# Patient Record
Sex: Female | Born: 1980 | Race: White | Hispanic: No | Smoking: Never smoker
Health system: Southern US, Community
[De-identification: ages and names within clinical notes are randomized; demographics above are authoritative.]

## PROBLEM LIST (undated history)

## (undated) DIAGNOSIS — F988 Other specified behavioral and emotional disorders with onset usually occurring in childhood and adolescence: Secondary | ICD-10-CM

## (undated) DIAGNOSIS — K259 Gastric ulcer, unspecified as acute or chronic, without hemorrhage or perforation: Secondary | ICD-10-CM

## (undated) DIAGNOSIS — N390 Urinary tract infection, site not specified: Secondary | ICD-10-CM

## (undated) DIAGNOSIS — T8859XA Other complications of anesthesia, initial encounter: Secondary | ICD-10-CM

## (undated) DIAGNOSIS — T4145XA Adverse effect of unspecified anesthetic, initial encounter: Secondary | ICD-10-CM

## (undated) DIAGNOSIS — R51 Headache: Secondary | ICD-10-CM

## (undated) DIAGNOSIS — J40 Bronchitis, not specified as acute or chronic: Secondary | ICD-10-CM

## (undated) DIAGNOSIS — K219 Gastro-esophageal reflux disease without esophagitis: Secondary | ICD-10-CM

## (undated) DIAGNOSIS — L509 Urticaria, unspecified: Secondary | ICD-10-CM

## (undated) DIAGNOSIS — N200 Calculus of kidney: Secondary | ICD-10-CM

## (undated) DIAGNOSIS — R519 Headache, unspecified: Secondary | ICD-10-CM

## (undated) DIAGNOSIS — Z9889 Other specified postprocedural states: Secondary | ICD-10-CM

## (undated) DIAGNOSIS — J189 Pneumonia, unspecified organism: Secondary | ICD-10-CM

## (undated) DIAGNOSIS — D509 Iron deficiency anemia, unspecified: Secondary | ICD-10-CM

## (undated) DIAGNOSIS — R112 Nausea with vomiting, unspecified: Secondary | ICD-10-CM

## (undated) HISTORY — PX: WISDOM TOOTH EXTRACTION: SHX21

## (undated) HISTORY — DX: Urticaria, unspecified: L50.9

## (undated) HISTORY — PX: CHOLECYSTECTOMY: SHX55

## (undated) HISTORY — PX: TYMPANOSTOMY TUBE PLACEMENT: SHX32

## (undated) HISTORY — PX: TONSILLECTOMY: SUR1361

## (undated) HISTORY — PX: UPPER GI ENDOSCOPY: SHX6162

## (undated) HISTORY — PX: OTHER SURGICAL HISTORY: SHX169

## (undated) HISTORY — PX: BREAST SURGERY: SHX581

## (undated) HISTORY — PX: INTUSSUSCEPTION REPAIR: SHX1847

## (undated) HISTORY — PX: GASTRIC BYPASS: SHX52

---
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
## 2003-08-24 ENCOUNTER — Other Ambulatory Visit: Admission: RE | Admit: 2003-08-24 | Discharge: 2003-08-24 | Payer: Self-pay | Admitting: Obstetrics & Gynecology

## 2007-02-03 ENCOUNTER — Inpatient Hospital Stay (HOSPITAL_COMMUNITY): Admission: AD | Admit: 2007-02-03 | Discharge: 2007-02-07 | Payer: Self-pay | Admitting: Obstetrics and Gynecology

## 2009-02-27 ENCOUNTER — Inpatient Hospital Stay (HOSPITAL_COMMUNITY): Admission: AD | Admit: 2009-02-27 | Discharge: 2009-02-27 | Payer: Self-pay | Admitting: Obstetrics & Gynecology

## 2009-04-05 ENCOUNTER — Inpatient Hospital Stay (HOSPITAL_COMMUNITY): Admission: AD | Admit: 2009-04-05 | Discharge: 2009-04-08 | Payer: Self-pay | Admitting: Obstetrics and Gynecology

## 2010-12-10 ENCOUNTER — Encounter: Payer: Self-pay | Admitting: Obstetrics and Gynecology

## 2011-02-27 LAB — CBC
HCT: 33.3 % — ABNORMAL LOW (ref 36.0–46.0)
HCT: 39.6 % (ref 36.0–46.0)
Hemoglobin: 13.6 g/dL (ref 12.0–15.0)
MCHC: 34.5 g/dL (ref 30.0–36.0)
MCV: 89.5 fL (ref 78.0–100.0)
RBC: 3.72 MIL/uL — ABNORMAL LOW (ref 3.87–5.11)
RBC: 4.39 MIL/uL (ref 3.87–5.11)
WBC: 18.7 10*3/uL — ABNORMAL HIGH (ref 4.0–10.5)

## 2011-02-28 LAB — URINALYSIS, ROUTINE W REFLEX MICROSCOPIC
Glucose, UA: NEGATIVE mg/dL
Specific Gravity, Urine: 1.02 (ref 1.005–1.030)
pH: 6.5 (ref 5.0–8.0)

## 2011-02-28 LAB — WET PREP, GENITAL

## 2014-09-29 ENCOUNTER — Other Ambulatory Visit: Payer: Self-pay | Admitting: Gastroenterology

## 2014-09-29 DIAGNOSIS — R1084 Generalized abdominal pain: Secondary | ICD-10-CM

## 2014-10-04 ENCOUNTER — Ambulatory Visit
Admission: RE | Admit: 2014-10-04 | Discharge: 2014-10-04 | Disposition: A | Discharge status: Home / Self Care | Payer: BC Managed Care – PPO | Source: Ambulatory Visit | Attending: Gastroenterology | Admitting: Gastroenterology

## 2014-10-04 DIAGNOSIS — R1084 Generalized abdominal pain: Secondary | ICD-10-CM

## 2014-10-04 MED ORDER — IOHEXOL 300 MG/ML  SOLN
100.0000 mL | Freq: Once | INTRAMUSCULAR | Status: AC | PRN
  Administered 2014-10-04: 100 mL via INTRAVENOUS

## 2015-03-30 ENCOUNTER — Other Ambulatory Visit: Payer: Self-pay | Admitting: General Surgery

## 2015-04-11 NOTE — Patient Instructions (Addendum)
Your procedure is scheduled on:  04/19/15  TUESDAY  [facility]- FOLLOW SIGNS TO SHORT STAY CENTER Short Stay Center at   0530   AM.   Call this number if you have problems the morning of surgery: [phone]        Do not eat food  Or drink :After Midnight. Monday NIGHT   Take these medicines the morning of surgery with A SIP OF WATER:Ranitidine (Zantac); Dexilant; Flonase if needed    .  Contacts, dentures or partial plates, or metal hairpins  can not be worn to surgery. Your family will be responsible for glasses, dentures, hearing aides while you are in surgery  Leave suitcase in the car. After surgery it may be brought to your room.  For patients admitted to the hospital, checkout time is 11:00 AM day of  discharge.         [facility] IS NOT RESPONSIBLE FOR ANY VALUABLES  Only one person with you in short stay morning of surgery. If time permits after you are ready, others may visit. No more than 2 person in room at a time.                                                                                               [facility] - Preparing for Surgery Before surgery, you can play an important role.  Because skin is not sterile, your skin needs to be as free of germs as possible.  You can reduce the number of germs on your skin by washing with CHG (chlorahexidine gluconate) soap before surgery.  CHG is an antiseptic cleaner which kills germs and bonds with the skin to continue killing germs even after washing. Please DO NOT use if you have an allergy to CHG or antibacterial soaps.  If your skin becomes reddened/irritated stop using the CHG and inform your nurse when you arrive at Short Stay. Do not shave (including legs and underarms) for at least 48 hours prior to the first CHG shower.  You may shave your face/neck. Please follow these instructions carefully:  1.  Shower with CHG Soap the night before surgery and the  morning of Surgery.  2.  If  you choose to wash your hair, wash your hair first as usual with your  normal  shampoo.  3.  After you shampoo, rinse your hair and body thoroughly to remove the  shampoo.                           4.  Use CHG as you would any other liquid soap.  You can apply chg directly  to the skin and wash                       Gently with a scrungie or clean washcloth.  5.  Apply the CHG Soap to your body ONLY FROM THE NECK DOWN.   Do not use on face/ open  Wound or open sores. Avoid contact with eyes, ears mouth and genitals (private parts).                       Wash face,  Genitals (private parts) with your normal soap.             6.  Wash thoroughly, paying special attention to the area where your surgery  will be performed.  7.  Thoroughly rinse your body with warm water from the neck down.  8.  DO NOT shower/wash with your normal soap after using and rinsing off  the CHG Soap.                9.  Pat yourself dry with a clean towel.            10.  Wear clean pajamas.            11.  Place clean sheets on your bed the night of your first shower and do not  sleep with pets. Day of Surgery : Do not apply any lotions/deodorants the morning of surgery.  Please wear clean clothes to the hospital/surgery center.  FAILURE TO FOLLOW THESE INSTRUCTIONS MAY RESULT IN THE CANCELLATION OF YOUR SURGERY PATIENT SIGNATURE_________________________________  NURSE SIGNATURE__________________________________  ________________________________________________________________________

## 2015-04-12 ENCOUNTER — Encounter (HOSPITAL_COMMUNITY)
Admission: RE | Admit: 2015-04-12 | Discharge: 2015-04-12 | Disposition: A | Discharge status: Home / Self Care | Payer: BLUE CROSS/BLUE SHIELD | Source: Ambulatory Visit | Attending: General Surgery | Admitting: General Surgery

## 2015-04-12 ENCOUNTER — Encounter (HOSPITAL_COMMUNITY): Payer: Self-pay

## 2015-04-12 DIAGNOSIS — Z01812 Encounter for preprocedural laboratory examination: Secondary | ICD-10-CM | POA: Diagnosis not present

## 2015-04-12 DIAGNOSIS — Z431 Encounter for attention to gastrostomy: Secondary | ICD-10-CM | POA: Insufficient documentation

## 2015-04-12 HISTORY — DX: Pneumonia, unspecified organism: J18.9

## 2015-04-12 HISTORY — DX: Other specified postprocedural states: Z98.890

## 2015-04-12 HISTORY — DX: Bronchitis, not specified as acute or chronic: J40

## 2015-04-12 HISTORY — DX: Gastric ulcer, unspecified as acute or chronic, without hemorrhage or perforation: K25.9

## 2015-04-12 HISTORY — DX: Gastro-esophageal reflux disease without esophagitis: K21.9

## 2015-04-12 HISTORY — DX: Other complications of anesthesia, initial encounter: T88.59XA

## 2015-04-12 HISTORY — DX: Headache, unspecified: R51.9

## 2015-04-12 HISTORY — DX: Headache: R51

## 2015-04-12 HISTORY — DX: Calculus of kidney: N20.0

## 2015-04-12 HISTORY — DX: Other specified behavioral and emotional disorders with onset usually occurring in childhood and adolescence: F98.8

## 2015-04-12 HISTORY — DX: Adverse effect of unspecified anesthetic, initial encounter: T41.45XA

## 2015-04-12 HISTORY — DX: Urinary tract infection, site not specified: N39.0

## 2015-04-12 HISTORY — DX: Nausea with vomiting, unspecified: R11.2

## 2015-04-12 HISTORY — DX: Iron deficiency anemia, unspecified: D50.9

## 2015-04-12 LAB — BASIC METABOLIC PANEL
Anion gap: 8 (ref 5–15)
BUN: 11 mg/dL (ref 6–20)
CALCIUM: 8.5 mg/dL — AB (ref 8.9–10.3)
CO2: 27 mmol/L (ref 22–32)
Chloride: 105 mmol/L (ref 101–111)
Creatinine, Ser: 0.77 mg/dL (ref 0.44–1.00)
GFR calc Af Amer: >60 mL/min (ref >60)
GFR calc non Af Amer: >60 mL/min (ref >60)
Glucose, Bld: 93 mg/dL (ref 65–99)
POTASSIUM: 4.4 mmol/L (ref 3.5–5.1)
SODIUM: 140 mmol/L (ref 135–145)

## 2015-04-12 LAB — CBC
HEMATOCRIT: 31.8 % — AB (ref 36.0–46.0)
HEMOGLOBIN: 10 g/dL — AB (ref 12.0–15.0)
MCH: 27 pg (ref 26.0–34.0)
MCHC: 31.4 g/dL (ref 30.0–36.0)
MCV: 85.9 fL (ref 78.0–100.0)
PLATELETS: 274 10*3/uL (ref 150–400)
RBC: 3.7 MIL/uL — ABNORMAL LOW (ref 3.87–5.11)
RDW: 14.4 % (ref 11.5–15.5)
WBC: 8.9 10*3/uL (ref 4.0–10.5)

## 2015-04-12 LAB — HCG, SERUM, QUALITATIVE: PREG SERUM: NEGATIVE

## 2015-04-12 NOTE — Progress Notes (Addendum)
CT abd Pevis with contrast per epic 10/04/2014 lungs mentioned OV note per chart per Dr Dulce Sellarutlaw 01/17/2015

## 2015-04-12 NOTE — Progress Notes (Signed)
CBC results per epic per PAT visit 04/12/2015 sent to Dr Johna SheriffHoxworth

## 2015-04-22 ENCOUNTER — Inpatient Hospital Stay (HOSPITAL_COMMUNITY): Payer: BLUE CROSS/BLUE SHIELD | Admitting: Anesthesiology

## 2015-04-22 ENCOUNTER — Encounter (HOSPITAL_COMMUNITY): Payer: Self-pay | Admitting: *Deleted

## 2015-04-22 ENCOUNTER — Inpatient Hospital Stay (HOSPITAL_COMMUNITY)
Admission: RE | Admit: 2015-04-22 | Discharge: 2015-04-25 | DRG: 328 | Disposition: A | Discharge status: Home / Self Care | Payer: BLUE CROSS/BLUE SHIELD | Source: Ambulatory Visit | Attending: General Surgery | Admitting: General Surgery

## 2015-04-22 ENCOUNTER — Encounter (HOSPITAL_COMMUNITY)
Admission: RE | Disposition: A | Discharge status: Home / Self Care | Payer: Self-pay | Source: Ambulatory Visit | Attending: General Surgery

## 2015-04-22 DIAGNOSIS — F329 Major depressive disorder, single episode, unspecified: Secondary | ICD-10-CM | POA: Diagnosis present

## 2015-04-22 DIAGNOSIS — K219 Gastro-esophageal reflux disease without esophagitis: Secondary | ICD-10-CM | POA: Diagnosis present

## 2015-04-22 DIAGNOSIS — Z8701 Personal history of pneumonia (recurrent): Secondary | ICD-10-CM

## 2015-04-22 DIAGNOSIS — T85898A Other specified complication of other internal prosthetic devices, implants and grafts, initial encounter: Secondary | ICD-10-CM | POA: Diagnosis present

## 2015-04-22 DIAGNOSIS — Z79899 Other long term (current) drug therapy: Secondary | ICD-10-CM | POA: Diagnosis not present

## 2015-04-22 DIAGNOSIS — Z01812 Encounter for preprocedural laboratory examination: Secondary | ICD-10-CM

## 2015-04-22 DIAGNOSIS — Z823 Family history of stroke: Secondary | ICD-10-CM | POA: Diagnosis not present

## 2015-04-22 DIAGNOSIS — Z87440 Personal history of urinary (tract) infections: Secondary | ICD-10-CM | POA: Diagnosis not present

## 2015-04-22 DIAGNOSIS — G44209 Tension-type headache, unspecified, not intractable: Secondary | ICD-10-CM | POA: Diagnosis present

## 2015-04-22 DIAGNOSIS — F988 Other specified behavioral and emotional disorders with onset usually occurring in childhood and adolescence: Secondary | ICD-10-CM | POA: Diagnosis present

## 2015-04-22 DIAGNOSIS — Z881 Allergy status to other antibiotic agents status: Secondary | ICD-10-CM | POA: Diagnosis not present

## 2015-04-22 DIAGNOSIS — Z8249 Family history of ischemic heart disease and other diseases of the circulatory system: Secondary | ICD-10-CM

## 2015-04-22 DIAGNOSIS — K289 Gastrojejunal ulcer, unspecified as acute or chronic, without hemorrhage or perforation: Secondary | ICD-10-CM | POA: Diagnosis present

## 2015-04-22 DIAGNOSIS — Z888 Allergy status to other drugs, medicaments and biological substances status: Secondary | ICD-10-CM

## 2015-04-22 DIAGNOSIS — G44211 Episodic tension-type headache, intractable: Secondary | ICD-10-CM | POA: Diagnosis not present

## 2015-04-22 DIAGNOSIS — Z8041 Family history of malignant neoplasm of ovary: Secondary | ICD-10-CM

## 2015-04-22 DIAGNOSIS — F909 Attention-deficit hyperactivity disorder, unspecified type: Secondary | ICD-10-CM | POA: Diagnosis not present

## 2015-04-22 DIAGNOSIS — R51 Headache: Secondary | ICD-10-CM

## 2015-04-22 DIAGNOSIS — Z833 Family history of diabetes mellitus: Secondary | ICD-10-CM

## 2015-04-22 DIAGNOSIS — Z803 Family history of malignant neoplasm of breast: Secondary | ICD-10-CM | POA: Diagnosis not present

## 2015-04-22 DIAGNOSIS — Z9884 Bariatric surgery status: Secondary | ICD-10-CM

## 2015-04-22 DIAGNOSIS — Z8711 Personal history of peptic ulcer disease: Secondary | ICD-10-CM

## 2015-04-22 DIAGNOSIS — D509 Iron deficiency anemia, unspecified: Secondary | ICD-10-CM | POA: Diagnosis present

## 2015-04-22 DIAGNOSIS — F419 Anxiety disorder, unspecified: Secondary | ICD-10-CM | POA: Diagnosis present

## 2015-04-22 DIAGNOSIS — Z87442 Personal history of urinary calculi: Secondary | ICD-10-CM | POA: Diagnosis not present

## 2015-04-22 DIAGNOSIS — R519 Headache, unspecified: Secondary | ICD-10-CM

## 2015-04-22 HISTORY — PX: LAPAROSCOPIC REVISION OF GASTROJEJUNOSTOMY: SHX5922

## 2015-04-22 HISTORY — PX: UPPER GI ENDOSCOPY: SHX6162

## 2015-04-22 LAB — HEMOGLOBIN AND HEMATOCRIT, BLOOD
HCT: 32.8 % — ABNORMAL LOW (ref 36.0–46.0)
Hemoglobin: 10.4 g/dL — ABNORMAL LOW (ref 12.0–15.0)

## 2015-04-22 SURGERY — REVISION, ANASTOMOSIS, GASTROJEJUNAL, LAPAROSCOPIC
Anesthesia: General

## 2015-04-22 MED ORDER — MORPHINE SULFATE 2 MG/ML IJ SOLN
2.0000 mg | INTRAMUSCULAR | Status: DC | PRN
  Administered 2015-04-22: 4 mg via INTRAVENOUS
  Administered 2015-04-22: 2 mg via INTRAVENOUS
  Administered 2015-04-22: 4 mg via INTRAVENOUS
  Administered 2015-04-22: 2 mg via INTRAVENOUS
  Administered 2015-04-23: 6 mg via INTRAVENOUS
  Administered 2015-04-23 (×2): 4 mg via INTRAVENOUS
  Administered 2015-04-23 (×3): 6 mg via INTRAVENOUS
  Administered 2015-04-23: 2 mg via INTRAVENOUS
  Administered 2015-04-23 (×3): 6 mg via INTRAVENOUS
  Administered 2015-04-24 (×3): 2 mg via INTRAVENOUS
  Administered 2015-04-24 (×5): 6 mg via INTRAVENOUS
  Filled 2015-04-22: qty 1
  Filled 2015-04-22: qty 3
  Filled 2015-04-22: qty 2
  Filled 2015-04-22: qty 3
  Filled 2015-04-22 (×2): qty 1
  Filled 2015-04-22: qty 2
  Filled 2015-04-22 (×2): qty 3
  Filled 2015-04-22: qty 2
  Filled 2015-04-22 (×4): qty 3
  Filled 2015-04-22: qty 1
  Filled 2015-04-22 (×4): qty 3
  Filled 2015-04-22 (×2): qty 2
  Filled 2015-04-22: qty 3

## 2015-04-22 MED ORDER — MIDAZOLAM HCL 5 MG/5ML IJ SOLN
INTRAMUSCULAR | Status: DC | PRN
  Administered 2015-04-22: 2 mg via INTRAVENOUS

## 2015-04-22 MED ORDER — ACETAMINOPHEN 160 MG/5ML PO SOLN
325.0000 mg | ORAL | Status: DC | PRN
  Administered 2015-04-25 (×2): 650 mg via ORAL
  Filled 2015-04-22 (×2): qty 20.3

## 2015-04-22 MED ORDER — FENTANYL CITRATE (PF) 250 MCG/5ML IJ SOLN
INTRAMUSCULAR | Status: AC
  Filled 2015-04-22: qty 5

## 2015-04-22 MED ORDER — OXYCODONE HCL 5 MG/5ML PO SOLN
5.0000 mg | ORAL | Status: DC | PRN
Start: 2015-04-23 — End: 2015-04-25
  Administered 2015-04-23: 5 mg via ORAL
  Administered 2015-04-24 – 2015-04-25 (×5): 10 mg via ORAL
  Filled 2015-04-22 (×3): qty 10
  Filled 2015-04-22: qty 25
  Filled 2015-04-22 (×2): qty 10

## 2015-04-22 MED ORDER — BUPIVACAINE-EPINEPHRINE 0.25% -1:200000 IJ SOLN
INTRAMUSCULAR | Status: AC
  Filled 2015-04-22: qty 1

## 2015-04-22 MED ORDER — FENTANYL CITRATE (PF) 100 MCG/2ML IJ SOLN
INTRAMUSCULAR | Status: DC | PRN
  Administered 2015-04-22 (×3): 50 ug via INTRAVENOUS
  Administered 2015-04-22: 100 ug via INTRAVENOUS

## 2015-04-22 MED ORDER — ONDANSETRON HCL 4 MG/2ML IJ SOLN
4.0000 mg | INTRAMUSCULAR | Status: DC | PRN
  Administered 2015-04-22 – 2015-04-24 (×4): 4 mg via INTRAVENOUS
  Filled 2015-04-22 (×4): qty 2

## 2015-04-22 MED ORDER — CHLORHEXIDINE GLUCONATE 4 % EX LIQD: 1.0000 "application " | Freq: Once | CUTANEOUS | Status: DC

## 2015-04-22 MED ORDER — DEXAMETHASONE SODIUM PHOSPHATE 10 MG/ML IJ SOLN
INTRAMUSCULAR | Status: DC | PRN
Start: 2015-04-22 — End: 2015-04-22
  Administered 2015-04-22: 10 mg via INTRAVENOUS

## 2015-04-22 MED ORDER — SCOPOLAMINE 1 MG/3DAYS TD PT72
MEDICATED_PATCH | TRANSDERMAL | Status: DC | PRN
  Administered 2015-04-22: 1 via TRANSDERMAL

## 2015-04-22 MED ORDER — LACTATED RINGERS IV SOLN
INTRAVENOUS | Status: DC
  Administered 2015-04-22: 1000 mL via INTRAVENOUS
  Administered 2015-04-22 (×2): via INTRAVENOUS

## 2015-04-22 MED ORDER — HEPARIN SODIUM (PORCINE) 5000 UNIT/ML IJ SOLN
5000.0000 [IU] | Freq: Three times a day (TID) | INTRAMUSCULAR | Status: DC
Start: 2015-04-23 — End: 2015-04-25
  Administered 2015-04-23 – 2015-04-25 (×7): 5000 [IU] via SUBCUTANEOUS
  Filled 2015-04-22 (×10): qty 1

## 2015-04-22 MED ORDER — HYDROMORPHONE HCL 1 MG/ML IJ SOLN
0.2500 mg | INTRAMUSCULAR | Status: DC | PRN
  Administered 2015-04-22 (×4): 0.5 mg via INTRAVENOUS

## 2015-04-22 MED ORDER — UNJURY VANILLA POWDER
2.0000 [oz_av] | Freq: Four times a day (QID) | ORAL | Status: DC
  Administered 2015-04-24 – 2015-04-25 (×4): 2 [oz_av] via ORAL

## 2015-04-22 MED ORDER — LACTATED RINGERS IR SOLN
Status: DC | PRN
  Administered 2015-04-22: 1000 mL

## 2015-04-22 MED ORDER — UNJURY CHOCOLATE CLASSIC POWDER: 2.0000 [oz_av] | Freq: Four times a day (QID) | ORAL | Status: DC

## 2015-04-22 MED ORDER — ROCURONIUM BROMIDE 100 MG/10ML IV SOLN
INTRAVENOUS | Status: AC
  Filled 2015-04-22: qty 1

## 2015-04-22 MED ORDER — ROCURONIUM BROMIDE 100 MG/10ML IV SOLN
INTRAVENOUS | Status: DC | PRN
  Administered 2015-04-22 (×3): 10 mg via INTRAVENOUS
  Administered 2015-04-22: 40 mg via INTRAVENOUS

## 2015-04-22 MED ORDER — SCOPOLAMINE 1 MG/3DAYS TD PT72
MEDICATED_PATCH | TRANSDERMAL | Status: AC
  Filled 2015-04-22: qty 1

## 2015-04-22 MED ORDER — EVICEL 5 ML EX KIT
PACK | CUTANEOUS | Status: DC | PRN
  Administered 2015-04-22: 1

## 2015-04-22 MED ORDER — PROPOFOL 10 MG/ML IV BOLUS
INTRAVENOUS | Status: DC | PRN
  Administered 2015-04-22: 150 mg via INTRAVENOUS

## 2015-04-22 MED ORDER — TISSEEL VH 10 ML EX KIT
PACK | CUTANEOUS | Status: AC
  Filled 2015-04-22: qty 1

## 2015-04-22 MED ORDER — LIDOCAINE HCL (CARDIAC) 20 MG/ML IV SOLN
INTRAVENOUS | Status: AC
  Filled 2015-04-22: qty 5

## 2015-04-22 MED ORDER — MIDAZOLAM HCL 2 MG/2ML IJ SOLN
INTRAMUSCULAR | Status: AC
  Filled 2015-04-22: qty 2

## 2015-04-22 MED ORDER — GLYCOPYRROLATE 0.2 MG/ML IJ SOLN
INTRAMUSCULAR | Status: AC
  Filled 2015-04-22: qty 3

## 2015-04-22 MED ORDER — KCL IN DEXTROSE-NACL 20-5-0.9 MEQ/L-%-% IV SOLN
INTRAVENOUS | Status: DC
  Administered 2015-04-22 – 2015-04-23 (×2): via INTRAVENOUS
  Administered 2015-04-23: 1000 mL via INTRAVENOUS
  Administered 2015-04-23 – 2015-04-24 (×2): via INTRAVENOUS
  Filled 2015-04-22 (×8): qty 1000

## 2015-04-22 MED ORDER — HYDROMORPHONE HCL 1 MG/ML IJ SOLN
INTRAMUSCULAR | Status: DC | PRN
  Administered 2015-04-22: 0.5 mg via INTRAVENOUS
  Administered 2015-04-22: 1 mg via INTRAVENOUS
  Administered 2015-04-22 (×5): 0.5 mg via INTRAVENOUS

## 2015-04-22 MED ORDER — PROPOFOL 10 MG/ML IV BOLUS
INTRAVENOUS | Status: AC
  Filled 2015-04-22: qty 20

## 2015-04-22 MED ORDER — NEOSTIGMINE METHYLSULFATE 10 MG/10ML IV SOLN
INTRAVENOUS | Status: DC | PRN
  Administered 2015-04-22: 3.5 mg via INTRAVENOUS

## 2015-04-22 MED ORDER — LIDOCAINE HCL (CARDIAC) 20 MG/ML IV SOLN
INTRAVENOUS | Status: DC | PRN
  Administered 2015-04-22: 50 mg via INTRAVENOUS

## 2015-04-22 MED ORDER — DEXTROSE 5 % IV SOLN
2.0000 g | INTRAVENOUS | Status: AC
  Administered 2015-04-22 (×2): 2 g via INTRAVENOUS

## 2015-04-22 MED ORDER — ACETAMINOPHEN 160 MG/5ML PO SOLN
650.0000 mg | ORAL | Status: DC | PRN
  Administered 2015-04-23: 650 mg via ORAL
  Filled 2015-04-22: qty 20.3

## 2015-04-22 MED ORDER — GLYCOPYRROLATE 0.2 MG/ML IJ SOLN
INTRAMUSCULAR | Status: DC | PRN
  Administered 2015-04-22: .6 mg via INTRAVENOUS

## 2015-04-22 MED ORDER — BUPIVACAINE-EPINEPHRINE 0.25% -1:200000 IJ SOLN
INTRAMUSCULAR | Status: DC | PRN
  Administered 2015-04-22: 35 mL

## 2015-04-22 MED ORDER — HYDROMORPHONE HCL 1 MG/ML IJ SOLN
INTRAMUSCULAR | Status: AC
  Filled 2015-04-22: qty 1

## 2015-04-22 MED ORDER — UNJURY CHICKEN SOUP POWDER: 2.0000 [oz_av] | Freq: Four times a day (QID) | ORAL | Status: DC

## 2015-04-22 MED ORDER — ONDANSETRON HCL 4 MG/2ML IJ SOLN
INTRAMUSCULAR | Status: AC
  Filled 2015-04-22: qty 2

## 2015-04-22 MED ORDER — LACTATED RINGERS IV SOLN: INTRAVENOUS | Status: DC

## 2015-04-22 MED ORDER — HYDROMORPHONE HCL 2 MG/ML IJ SOLN
INTRAMUSCULAR | Status: AC
  Filled 2015-04-22: qty 1

## 2015-04-22 MED ORDER — CEFOXITIN SODIUM 2 G IV SOLR
INTRAVENOUS | Status: AC
  Filled 2015-04-22: qty 2

## 2015-04-22 MED ORDER — DEXTROSE 5 % IV SOLN
INTRAVENOUS | Status: AC
  Filled 2015-04-22: qty 2

## 2015-04-22 MED ORDER — HYDROMORPHONE HCL 1 MG/ML IJ SOLN
INTRAMUSCULAR | Status: AC
  Administered 2015-04-22: 0.5 mg via INTRAVENOUS
  Filled 2015-04-22: qty 1

## 2015-04-22 MED ORDER — ONDANSETRON HCL 4 MG/2ML IJ SOLN
INTRAMUSCULAR | Status: DC | PRN
  Administered 2015-04-22: 4 mg via INTRAVENOUS

## 2015-04-22 SURGICAL SUPPLY — 57 items
APL SRG 32X5 SNPLK LF DISP (MISCELLANEOUS) ×2
APPLICATOR DUAL LIQUID (MISCELLANEOUS) IMPLANT
APPLIER CLIP ROT 10 11.4 M/L (STAPLE)
APR CLP MED LRG 11.4X10 (STAPLE)
BAG SPEC RTRVL LRG 6X4 10 (ENDOMECHANICALS) ×1
CHLORAPREP W/TINT 26ML (MISCELLANEOUS) ×3 IMPLANT
CLIP APPLIE ROT 10 11.4 M/L (STAPLE) IMPLANT
CLIP SUT LAPRA TY ABSORB (SUTURE) ×3 IMPLANT
CUTTER FLEX LINEAR 45M (STAPLE) ×3 IMPLANT
DRAPE LAPAROSCOPIC ABDOMINAL (DRAPES) ×3 IMPLANT
ELECT REM PT RETURN 9FT ADLT (ELECTROSURGICAL) ×3
ELECTRODE REM PT RTRN 9FT ADLT (ELECTROSURGICAL) ×2 IMPLANT
GLOVE ECLIPSE 7.5 STRL STRAW (GLOVE) ×3 IMPLANT
GLOVE SURG SS PI 7.5 STRL IVOR (GLOVE) ×3 IMPLANT
GOWN STRL REUS W/TWL XL LVL3 (GOWN DISPOSABLE) ×15 IMPLANT
KIT BASIN OR (CUSTOM PROCEDURE TRAY) ×3 IMPLANT
KIT GASTRIC LAVAGE 34FR ADT (SET/KITS/TRAYS/PACK) ×3 IMPLANT
LIQUID BAND (GAUZE/BANDAGES/DRESSINGS) ×3 IMPLANT
PEN SKIN MARKING BROAD (MISCELLANEOUS) ×3 IMPLANT
POUCH SPECIMEN RETRIEVAL 10MM (ENDOMECHANICALS) ×3 IMPLANT
RELOAD BLUE (STAPLE) IMPLANT
RELOAD STAPLE TA45 3.5 REG BLU (ENDOMECHANICALS) ×3 IMPLANT
RELOAD STAPLER BLUE 60MM (STAPLE) ×2 IMPLANT
RELOAD STAPLER GOLD 60MM (STAPLE) ×2 IMPLANT
RELOAD STAPLER GREEN 60MM (STAPLE) ×2 IMPLANT
RELOAD WHITE ECR60W (STAPLE) IMPLANT
SCISSORS LAP 5X35 DISP (ENDOMECHANICALS) ×3 IMPLANT
SEALANT SURGICAL APPL DUAL CAN (MISCELLANEOUS) ×3 IMPLANT
SET IRRIG TUBING LAPAROSCOPIC (IRRIGATION / IRRIGATOR) ×3 IMPLANT
SHEARS HARMONIC ACE PLUS 36CM (ENDOMECHANICALS) ×3 IMPLANT
SLEEVE ADV FIXATION 5X100MM (TROCAR) ×9 IMPLANT
SPONGE LAP 18X18 X RAY DECT (DISPOSABLE) ×3 IMPLANT
STAPLE ECHEON FLEX 60 POW ENDO (STAPLE) ×3 IMPLANT
STAPLER ECHELON LONG 60 440 (INSTRUMENTS) IMPLANT
STAPLER RELOAD BLUE 60MM (STAPLE) ×3
STAPLER RELOAD GOLD 60MM (STAPLE) ×3
STAPLER RELOAD GREEN 60MM (STAPLE) ×3
STAPLER VISISTAT 35W (STAPLE) ×3 IMPLANT
SUT DVC SILK 2.0X39 (SUTURE) ×3 IMPLANT
SUT DVC VICRYL PGA 2.0X39 (SUTURE) ×18 IMPLANT
SUT MNCRL AB 4-0 PS2 18 (SUTURE) ×6 IMPLANT
SUT SILK 2 0 (SUTURE)
SUT SILK 2 0 SH CR/8 (SUTURE) IMPLANT
SUT SILK 2-0 18XBRD TIE 12 (SUTURE) IMPLANT
SUT SILK 3 0 (SUTURE)
SUT SILK 3 0 SH CR/8 (SUTURE) IMPLANT
SUT SILK 3-0 18XBRD TIE 12 (SUTURE) IMPLANT
SUT VIC AB 2-0 SH 27 (SUTURE) ×1
SUT VIC AB 2-0 SH 27X BRD (SUTURE) ×2 IMPLANT
SUT VICRYL 0 UR6 27IN ABS (SUTURE) ×3 IMPLANT
TOWEL OR 17X26 10 PK STRL BLUE (TOWEL DISPOSABLE) ×3 IMPLANT
TRAY FOLEY W/METER SILVER 14FR (SET/KITS/TRAYS/PACK) ×3 IMPLANT
TRAY LAPAROSCOPIC (CUSTOM PROCEDURE TRAY) ×3 IMPLANT
TROCAR ADV FIXATION 12X100MM (TROCAR) ×3 IMPLANT
TROCAR ADV FIXATION 5X100MM (TROCAR) ×3 IMPLANT
TROCAR BLADELESS OPT 5 100 (ENDOMECHANICALS) ×3 IMPLANT
TROCAR XCEL NON-BLD 11X100MML (ENDOMECHANICALS) IMPLANT

## 2015-04-22 NOTE — Transfer of Care (Signed)
Immediate Anesthesia Transfer of Care Note  Patient: Erin FinnerMandy L Sak  Procedure(s) Performed: Procedure(s): LAPAROSCOPIC RESECTION OF GASTROJEJUNOSTOMY (N/A) UPPER GI ENDOSCOPY  Patient Location: PACU  Anesthesia Type:General  Level of Consciousness:  sedated, patient cooperative and responds to stimulation  Airway & Oxygen Therapy:Patient Spontanous Breathing and Patient connected to face mask oxgen  Post-op Assessment:  Report given to PACU RN and Post -op Vital signs reviewed and stable  Post vital signs:  Reviewed and stable  Last Vitals:  Filed Vitals:   04/22/15 1046  BP: 108/71  Pulse: 66  Temp: 37.1 C  Resp: 16    Complications: No apparent anesthesia complications

## 2015-04-22 NOTE — Anesthesia Preprocedure Evaluation (Addendum)
Anesthesia Evaluation  Patient identified by MRN, date of birth, ID band Patient awake    Reviewed: Allergy & Precautions, H&P , NPO status , Patient's Chart, lab work & pertinent test results  History of Anesthesia Complications (+) PONV  Airway Mallampati: II  TM Distance: >3 FB Neck ROM: full    Dental no notable dental hx. (+) Teeth Intact, Dental Advisory Given   Pulmonary neg pulmonary ROS,  breath sounds clear to auscultation  Pulmonary exam normal       Cardiovascular Exercise Tolerance: Good negative cardio ROS Normal cardiovascular examRhythm:regular Rate:Normal     Neuro/Psych negative neurological ROS  negative psych ROS   GI/Hepatic negative GI ROS, Neg liver ROS, PUD, GERD-  Medicated and Controlled,  Endo/Other  negative endocrine ROS  Renal/GU negative Renal ROS  negative genitourinary   Musculoskeletal   Abdominal   Peds  Hematology negative hematology ROS (+) anemia , hgb 10   Anesthesia Other Findings   Reproductive/Obstetrics negative OB ROS                            Anesthesia Physical Anesthesia Plan  ASA: II  Anesthesia Plan: General   Post-op Pain Management:    Induction: Intravenous  Airway Management Planned: Oral ETT  Additional Equipment:   Intra-op Plan:   Post-operative Plan: Extubation in OR  Informed Consent: I have reviewed the patients History and Physical, chart, labs and discussed the procedure including the risks, benefits and alternatives for the proposed anesthesia with the patient or authorized representative who has indicated his/her understanding and acceptance.   Dental Advisory Given  Plan Discussed with: CRNA and Surgeon  Anesthesia Plan Comments:         Anesthesia Quick Evaluation

## 2015-04-22 NOTE — Anesthesia Procedure Notes (Signed)
Procedure Name: Intubation Date/Time: 04/22/2015 1:02 PM Performed by: Paris LoreBLANTON, Aubry Rankin M Pre-anesthesia Checklist: Patient identified, Emergency Drugs available, Suction available, Patient being monitored and Timeout performed Patient Re-evaluated:Patient Re-evaluated prior to inductionOxygen Delivery Method: Circle system utilized Preoxygenation: Pre-oxygenation with 100% oxygen Intubation Type: IV induction Ventilation: Mask ventilation without difficulty Laryngoscope Size: Mac and 4 Grade View: Grade I Tube type: Oral Tube size: 7.5 mm Number of attempts: 1 Airway Equipment and Method: Stylet Placement Confirmation: ETT inserted through vocal cords under direct vision,  positive ETCO2,  CO2 detector and breath sounds checked- equal and bilateral Secured at: 21 cm Tube secured with: Tape Dental Injury: Teeth and Oropharynx as per pre-operative assessment

## 2015-04-22 NOTE — Op Note (Signed)
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 She has developed a marginal ulcer which is markedly symptomatic and has been unresponsive to a number of months of medical management. Following extensive workup and discussion detailed elsewhere we have elected to proceed with surgical treatment with resection of her gastro-jejunostomy and creation of a new anastomosis.    Procedure Detail: patient was taken to the operating room, placed in the supine position on the operating table, and general endotracheal anesthesia induced. Foley cath was placed. She received preoperative IV antibiotics. Heparin subcutaneously was given. The abdomen was widely sterilely prepped and draped. Patient timeout was performed and correct procedure verified. Access was obtained with a 5 mm Optiview trocar in the left upper quadrant without difficulty and pneumoperitoneum established. There were no significant anterior abdominal wall adhesions. Under direct vision a 5 mm trocar was placed through a previous infraumbilical incision for the camera port, a 12 mm trocar in the right upper quadrant and a 5 mm trocar more laterally in the left upper quadrant. Through a 5 mm subxiphoid sites and a straight retractor was placed. Initially some adhesions of the liver down to the gastric remnant were sharply divided in the next retractor was used to elevate the liver with excellent exposure of the stomach pouch and gastrojejunostomy. Finally an additional 5 mm trocar was placed laterally in the left abdomen. The  anatomy was carefully examined. There were moderate but not severe adhesions around the gastrojejunostomy. The gastric pouch measured about 6 cm in length. The gastric jejunostomy appeared possibly a little thickened but not severely inflamed. The Roux limb was retrocolic and retrogastric in position. We examined the small bowel and located the ligament of Treitz and the biliopancreatic limb which was quite short. The common channel was examined about halfway down to the ileocecal valve and then the Roux limb was traced back proximally toward the entrance into the transverse mesocolon and everything appeared normal. The gastric remnant was adherent to the gastric pouch and anastomosis and jejunum near the anastomosis and these adhesions were carefully taken down with mostly sharp and some Harmonic scalpel dissection and completely freed. Small bowel adhesions were freed back down under the gastric remnant until the Roux limb could be freely brought up for additional length for anastomosis. Posterior adhesions to the anastomosis and the gastric pouch were carefully divided mostly with sharp dissection just above the pancreas. An area for resection of the gastric pouch was chosen about 2 cm above the anastomosis leaving an approximately 4 cm pouch. This portion of the pouch was cleared of mesentery along the lesser curve and then was divided with an initial firing of the green load echelon 60 mm stapler and a second firing of the gold load 60 mm echelon stapler. The greater curve of the pouch had been completely mobilized prior to this up to the angle of Hiss. Adhesions were then dissected down below the anastomosis and a point of division of the small bowel was chosen where there was no thickening or edema. The mesentery of the small bowel was taken with the Harmonic scalpel down to this point and then the bowel was  divided with a single firing of the blue load 60 mm echelon stapler. The specimen was placed in an  Endo Catch bag and left in the peritoneal cavity for later retrieval. The Roux limb could be very easily brought up to the gastric remnant without any tension. It lay naturally with the candycane facing toward the right as the previous anastomosis had been constructed. The gastrojejunostomy was then created with an initial posterior row of running 2-0 Vicryl between the Roux limb and the staple line of the gastric pouch. The Ewald tube was advanced into the pouch and enterotomies made in the pouch and the Roux limb with the Harmonic scalpel. The anastomosis was created with a single firing of the linear 45 mm blue load stapler creating approximately 3 cm anastomosis. The staple line was intact without bleeding. The common enterotomy was then closed from either end with running 2-0 Vicryl. The Ewald tube was passed back down through the anastomosis and an outer row of seromuscular 2-0 Vicryl was placed anteriorly. The anastomosis appeared very secure, with good blood supply and under no tension. The Ewald tube was removed. Dr. Ezzard StandingNewman then went above and performed upper endoscopy and with the gastric pouch tensely insufflated with air and under saline irrigation of the outlet clamp there was no evidence of leak. The pouch measured 4 cm. The anastomosis was visualized and patent. Following this the abdomen was thoroughly irrigated and hemostasis assured. Tisseel tissue sealant was used to coat the suture and staple lines. The Nathanson retractor was removed under direct vision. The infraumbilical incision was extended inferiorly slightly to allow removal of the specimen in the pouch. All trochars were removed. A small fascial defect below the umbilicus was closed with interrupted 0 Vicryl. Skin incisions were closed with subcutaneous 4-0 Monocryl and Dermabond. Sponge needle and instrument counts were correct.    Findings: As above  Estimated Blood Loss:  Minimal         Drains: none  Blood Given: none           Specimens: portion of stomach and small bowel (gastrojejunostomy)        Complications:  * No complications entered in OR log *         Disposition: PACU - hemodynamically stable.         Condition: stable

## 2015-04-22 NOTE — Interval H&P Note (Signed)
History and Physical Interval Note:  04/22/2015 12:47 PM  Erin Willis  has presented today for surgery, with the diagnosis of marginal ulcer status post gastic bypass  The various methods of treatment have been discussed with the patient and family. After consideration of risks, benefits and other options for treatment, the patient has consented to  Procedure(s): LAPAROSCOPIC RESECTION OF GASTROJEJUNOSTOMY (N/A) as a surgical intervention .  The patient's history has been reviewed, patient examined, no change in status, stable for surgery.  I have reviewed the patient's chart and labs.  Questions were answered to the patient's satisfaction.     Akeria Hedstrom T

## 2015-04-22 NOTE — Anesthesia Postprocedure Evaluation (Signed)
  Anesthesia Post-op Note  Patient: Erin Willis  Procedure(s) Performed: Procedure(s) (LRB): LAPAROSCOPIC RESECTION OF GASTROJEJUNOSTOMY (N/A) UPPER GI ENDOSCOPY  Patient Location: PACU  Anesthesia Type: General  Level of Consciousness: awake and alert   Airway and Oxygen Therapy: Patient Spontanous Breathing  Post-op Pain: mild  Post-op Assessment: Post-op Vital signs reviewed, Patient's Cardiovascular Status Stable, Respiratory Function Stable, Patent Airway and No signs of Nausea or vomiting  Last Vitals:  Filed Vitals:   04/22/15 1841  BP: 111/60  Pulse: 84  Temp: 36.8 C  Resp: 16    Post-op Vital Signs: stable   Complications: No apparent anesthesia complications

## 2015-04-22 NOTE — H&P (Signed)
History of Present Illness Erin Willis T. [name]; 03/30/2015 2:50 PM) Patient words: Evaluate gastrojejunal ulcer.  The patient is a 34 year old female who presents with non-malignant abdominal pain. 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 Her surgery initially was uncomplicated. She lost from 287-170 pounds as her lowest weight and was doing well. Just over a year ago she had a laparoscopic assisted small bowel resection and [place] for an intussusception. She did well following this procedure. She has had some mild expected weight regain but continued to feel well until late summer or early fall when she began to develop burning and sharp epigastric and left upper quadrant abdominal pain. She was evaluated in the emergency room with a CT scan indicating previous surgical changes but no acute abnormality. She then saw Dr. Dulce Sellar. She noted she had been taking increasing amounts of over-the-counter acid reduction medications to control the pain. Endoscopy showed a marginal ulcer at the anastomosis. She has been treated with Dexilant and sucralfate since that time which improves the pain when she takes it but it does not go away. If she does not take the medicine for a day or 2 the pain gets very severe. Recent repeat endoscopy showed a persistent nonbleeding ulcer at the jejunum near the anastomosis. She has ongoing nausea and lack of appetite and has lost about 20 pounds since onset of the illness. She has never smoked cigarettes. No previous history of ulcer disease. She did have an endoscopy somewhat early on after her bypass surgery but this was negative and this was a brief illness. No melena or hematemesis. She has had some iron deficiency anemia recently on follow-up with her primary physician.   Other Problems [name], [name]; 03/30/2015 1:35 PM) Anxiety  Disorder Cholelithiasis Depression Gastric Ulcer Gastroesophageal Reflux Disease Migraine Headache  Past Surgical History [name], [name]; 03/30/2015 1:35 PM) Breast Augmentation Bilateral. Gallbladder Surgery - Laparoscopic Gastric Bypass Oral Surgery Resection of Small Bowel Resection of Stomach Tonsillectomy  Diagnostic Studies History [name], [name]; 03/30/2015 1:35 PM) Colonoscopy never Mammogram never Pap Smear 1-5 years ago  Allergies [name], [name]; 03/30/2015 1:40 PM) Phenergan *ANTIHISTAMINES* patient reports Phenergan reaction: visual & auditory hallucinations Vancomycin HCl *ANTI-INFECTIVE AGENTS - MISC.*  Medication History [name], [name]; 03/30/2015 1:38 PM) Amphetamine-Dextroamphetamine (  Tablet, Oral) Active. Dexilant (  Capsule DR, Oral) Active. Zantac (  Tablet, Oral) Active. Ativan (  Tablet, Oral) Active. Zofran ODT (  Tablet Disperse, Oral) Active.  Social History [name], [place]; 03/30/2015 1:35 PM) Alcohol use Occasional alcohol use. Caffeine use Carbonated beverages, Tea. No drug use Tobacco use Never smoker.  Family History [name], [place]; 03/30/2015 1:35 PM) Alcohol Abuse Family Members In General, Mother. Breast Cancer Family Members In General. Cerebrovascular Accident Family Members In General. Depression Family Members In General. Diabetes Mellitus Family Members In General. Heart Disease Family Members In General, Father, Mother. Heart disease in female family member before age 39 Heart disease in female family member before age 52 Hypertension Family Members In General, Father, Mother. Ischemic Bowel Disease Son. Kidney Disease Family Members In General. Ovarian Cancer Family Members In General. Respiratory Condition Son.  Pregnancy / Birth History [name], [place]; 03/30/2015 1:35 PM) Age at menarche 14 years. Contraceptive History Contraceptive  implant, Oral contraceptives. Gravida 2 Irregular periods Maternal age 60-30 Para 2  Review of Systems [name] [name]; 03/30/2015 1:35 PM) General Present- Appetite Loss, Fatigue and Weight Loss. Not  Present- Chills, Fever, Night Sweats and Weight Gain. Skin Present- Dryness. Not Present- Change in Wart/Mole, Hives, Jaundice, New Lesions, Non-Healing Wounds, Rash and Ulcer. HEENT Present- Seasonal Allergies and Wears glasses/contact lenses. Not Present- Earache, Hearing Loss, Hoarseness, Nose Bleed, Oral Ulcers, Ringing in the Ears, Sinus Pain, Sore Throat, Visual Disturbances and Yellow Eyes. Respiratory Not Present- Bloody sputum, Chronic Cough, Difficulty Breathing, Snoring and Wheezing. Breast Not Present- Breast Mass, Breast Pain, Nipple Discharge and Skin Changes. Cardiovascular Not Present- Chest Pain, Difficulty Breathing Lying Down, Leg Cramps, Palpitations, Rapid Heart Rate, Shortness of Breath and Swelling of Extremities. Gastrointestinal Present- Abdominal Pain, Indigestion, Nausea and Vomiting. Not Present- Bloating, Bloody Stool, Change in Bowel Habits, Chronic diarrhea, Constipation, Difficulty Swallowing, Excessive gas, Gets full quickly at meals, Hemorrhoids and Rectal Pain. Female Genitourinary Not Present- Frequency, Nocturia, Painful Urination, Pelvic Pain and Urgency. Musculoskeletal Present- Muscle Weakness. Not Present- Back Pain, Joint Pain, Joint Stiffness, Muscle Pain and Swelling of Extremities. Neurological Present- Fainting and Headaches. Not Present- Decreased Memory, Numbness, Seizures, Tingling, Tremor, Trouble walking and Weakness. Psychiatric Present- Anxiety and Change in Sleep Pattern. Not Present- Bipolar, Depression, Fearful and Frequent crying. Endocrine Present- Cold Intolerance. Not Present- Excessive Hunger, Hair Changes, Heat Intolerance, Hot flashes and New Diabetes. Hematology Not Present- Easy Bruising, Excessive bleeding, Gland problems, HIV  and Persistent Infections.   Vitals (Christy Moore [name]; 5/11/201[name]6 1:35 PM) 03/30/2015 1:33 PM Weight: 170 lb Height: 70in Body Surface Area: 1.95 m Body Mass Index: 24.39 kg/m Temp.: 97.78F(Temporal)  Pulse: 78 (Regular)  Resp.: 16 (Unlabored)  BP: 116/72 (Sitting, Left Arm, Standard)    Physical Exam Erin Willis(Aamirah Salmi T. Cherryl Babin MD; 03/30/2015 1:51 PM) The physical exam findings are as follows: Note:General: Alert, well-developed and well nourished Caucasian female, in no distress Skin: Warm and dry, tanned, several tatoos, without rash or infection. HEENT: No palpable masses or thyromegaly. Sclera nonicteric. Pupils equal round and reactive. Oropharynx clear. Lymph nodes: No cervical, supraclavicular, or inguinal nodes palpable. Lungs: Breath sounds clear and equal. No wheezing or increased work of breathing. Cardiovascular: Regular rate and rhythm without murmer. No JVD or edema. Peripheral pulses intact. No carotid bruits. Abdomen: Nondistended. Soft with minimal left upper quadrant tenderness, no guarding. No masses palpable. No organomegaly. Well-healed incisions. No palpable hernias. Extremities: No edema or joint swelling or deformity. No chronic venous stasis changes. Neurologic: Alert and fully oriented. Gait normal. No focal weakness. Psychiatric: Normal mood and affect. Thought content appropriate with normal judgement and insight    Assessment & Plan Erin Willis(Kendyl Festa T. 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 This has been persistent for at least 6 months despite maximal medical management. No cigarette smoking or other apparent contributing factors. I think the chance of this healing with further medical management is low and she is at risk for complication such as  perforation, bleeding or obstruction. I therefore recommended laparoscopic resection of her gastrojejunostomy. I have reviewed her CT scan which shows a reasonable sized remaining gastric pouch. We reviewed the procedure in detail including its exact nature and risks including possible need for open procedure, anesthetic risks, bleeding, leakage and infection and further ulceration. She feels strongly that she wants to go ahead with surgery. I don't think any further workup is indicated. I am going to obtain her previous operative reports of possible from her bypass surgery and intussusception. Current Plans  Schedule for Surgery Laparoscopic and possible open resection of gastrojejunostomy

## 2015-04-23 ENCOUNTER — Inpatient Hospital Stay (HOSPITAL_COMMUNITY): Payer: BLUE CROSS/BLUE SHIELD

## 2015-04-23 LAB — CBC WITH DIFFERENTIAL/PLATELET
BASOS ABS: 0 10*3/uL (ref 0.0–0.1)
Basophils Relative: 0 % (ref 0–1)
EOS PCT: 0 % (ref 0–5)
Eosinophils Absolute: 0 10*3/uL (ref 0.0–0.7)
HCT: 30.5 % — ABNORMAL LOW (ref 36.0–46.0)
Hemoglobin: 9.6 g/dL — ABNORMAL LOW (ref 12.0–15.0)
LYMPHS ABS: 1.4 10*3/uL (ref 0.7–4.0)
LYMPHS PCT: 13 % (ref 12–46)
MCH: 26.9 pg (ref 26.0–34.0)
MCHC: 31.5 g/dL (ref 30.0–36.0)
MCV: 85.4 fL (ref 78.0–100.0)
Monocytes Absolute: 1.1 10*3/uL — ABNORMAL HIGH (ref 0.1–1.0)
Monocytes Relative: 10 % (ref 3–12)
NEUTROS ABS: 8.1 10*3/uL — AB (ref 1.7–7.7)
Neutrophils Relative %: 77 % (ref 43–77)
Platelets: 289 10*3/uL (ref 150–400)
RBC: 3.57 MIL/uL — ABNORMAL LOW (ref 3.87–5.11)
RDW: 14 % (ref 11.5–15.5)
WBC: 10.5 10*3/uL (ref 4.0–10.5)

## 2015-04-23 LAB — COMPREHENSIVE METABOLIC PANEL
ALBUMIN: 3.6 g/dL (ref 3.5–5.0)
ALT: 25 U/L (ref 14–54)
AST: 29 U/L (ref 15–41)
Alkaline Phosphatase: 57 U/L (ref 38–126)
Anion gap: 4 — ABNORMAL LOW (ref 5–15)
BILIRUBIN TOTAL: 0.6 mg/dL (ref 0.3–1.2)
BUN: 6 mg/dL (ref 6–20)
CALCIUM: 7.8 mg/dL — AB (ref 8.9–10.3)
CO2: 28 mmol/L (ref 22–32)
Chloride: 108 mmol/L (ref 101–111)
Creatinine, Ser: 0.58 mg/dL (ref 0.44–1.00)
GLUCOSE: 111 mg/dL — AB (ref 65–99)
Potassium: 3.4 mmol/L — ABNORMAL LOW (ref 3.5–5.1)
Sodium: 140 mmol/L (ref 135–145)
Total Protein: 5.9 g/dL — ABNORMAL LOW (ref 6.5–8.1)

## 2015-04-23 LAB — HEMOGLOBIN AND HEMATOCRIT, BLOOD
HEMATOCRIT: 31.8 % — AB (ref 36.0–46.0)
HEMOGLOBIN: 10.2 g/dL — AB (ref 12.0–15.0)

## 2015-04-23 MED ORDER — LORAZEPAM 2 MG/ML IJ SOLN: 0.5000 mg | Freq: Every evening | INTRAMUSCULAR | Status: DC | PRN

## 2015-04-23 MED ORDER — LORAZEPAM 2 MG/ML IJ SOLN
0.5000 mg | Freq: Once | INTRAMUSCULAR | Status: AC
  Administered 2015-04-23: 0.5 mg via INTRAVENOUS
  Filled 2015-04-23: qty 1

## 2015-04-23 MED ORDER — SUMATRIPTAN SUCCINATE 6 MG/0.5ML ~~LOC~~ SOLN
6.0000 mg | Freq: Two times a day (BID) | SUBCUTANEOUS | Status: DC | PRN
  Administered 2015-04-23: 6 mg via SUBCUTANEOUS
  Filled 2015-04-23 (×2): qty 0.5

## 2015-04-23 NOTE — Plan of Care (Signed)
Problem: Phase I Progression Outcomes Goal: Pain controlled with appropriate interventions Outcome: Not Progressing Morphine and ice not helping HA which pt thinks is a migraine.  Spoke with Dr. Gerrit FriendsGerkin (who was in sx) about pt's now crying and saying "nothing works; the doctor needs to give me something that works; if you don't call him, I will."  Pt refused to try po meds for HA, then agreed (given by a 2nd RN) as primary RN paging sx.  Sx requested RN get [facility] consult to address HA, since sx unable to leave OR.  RN paged consult service and waiting for response from MD.  Pt now requesting 2nd Imitrex dose (refused earlier, stating it had not helped lower HA by even 1 level).  Advised pt that we need to determine result of other two medicines just administered before adding to them, since they had not helped previously and told her HA consult had been requested.  Pt using angry tone of voice and said, "I don't need a lecture, nothing works anyway and no one listens to me."

## 2015-04-23 NOTE — Progress Notes (Signed)
As per request from Dr. Gerrit FriendsGerkin, requested consult from [facility] MD ref pt's HA.  Spoke with Dr. Adela Glimpseoutova who ordered dose of Ativan.  She said she was admitting two patients in ED, but would follow-up later.  Advised RN to page her if pt's status did not improve.  Pt given Ativan and next morphine dose.  Pt calmly resting in bed with visitor present.  Have given report to oncoming RN.

## 2015-04-23 NOTE — Progress Notes (Signed)
Patient ID: Freddy FinnerMandy L Hegwood, female   DOB: April 24, 1981, 34 y.o.   MRN: [id number]  General Surgery [facility]  POD#: 1  Subjective: Patient complains of abdominal pain - using IV morphine.  Awaiting gastrograffin study this AM.  Objective: Vital signs in last 24 hours: Temp:  [98.1 F (36.7 C)-98.8 F (37.1 C)] 98.5 F (36.9 C) (06/04 0510) Pulse Rate:  [57-98] 57 (06/04 0510) Resp:  [12-21] 15 (06/04 0510) BP: (108-136)/(60-81) 124/66 mmHg (06/04 0510) SpO2:  [95 %-100 %] 100 % (06/04 0510) Weight:  [79.379 kg (175 lb)] 79.379 kg (175 lb) (06/03 1048) Last BM Date: 04/21/15  Intake/Output from previous day: 06/03 0701 - 06/04 0700 In: 3328.3 [I.V.:3328.3] Out: 400 [Urine:400] Intake/Output this shift: Total I/O In: 1125 [I.V.:1125] Out: 500 [Urine:500]  Physical Exam: HEENT - sclerae clear, mucous membranes moist Neck - soft Chest - clear bilaterally Cor - RRR Abdomen - soft, wounds dry and intact; few BS present Ext - no edema, non-tender Neuro - alert & oriented, no focal deficits  Lab Results:   Recent Labs  04/22/15 2117 04/23/15 0434  WBC  --  10.5  HGB 10.4* 9.6*  HCT 32.8* 30.5*  PLT  --  289   BMET No results for input(s): NA, K, CL, CO2, GLUCOSE, BUN, CREATININE, CALCIUM in the last 72 hours. PT/INR No results for input(s): LABPROT, INR in the last 72 hours. Comprehensive Metabolic Panel:    Component Value Date/Time   NA 140 04/12/2015 1504   K 4.4 04/12/2015 1504   CL 105 04/12/2015 1504   CO2 27 04/12/2015 1504   BUN 11 04/12/2015 1504   CREATININE 0.77 04/12/2015 1504   GLUCOSE 93 04/12/2015 1504   CALCIUM 8.5* 04/12/2015 1504    Studies/Results: No results found.  Anti-infectives: Anti-infectives    Start     Dose/Rate Route Frequency Ordered Stop   04/22/15 1046  cefOXitin (MEFOXIN) 2 g in dextrose 5 % 50 mL IVPB     2 g 100 mL/hr over 30 Minutes Intravenous On call to O.R. 04/22/15 1046 04/22/15 1511       Assessment & Plans: Status post re-do gastrojejunostomy for marginal ulcer  Await radiographic study this AM  Diet per protocol  Pain Rx  Velora Hecklerodd M. Linzey Ramser, MD, [facility] Office: [phone]   Muntaha Vermette Judie PetitM 04/23/2015

## 2015-04-23 NOTE — Plan of Care (Signed)
Problem: Phase I Progression Outcomes Goal: Pain controlled with appropriate interventions Outcome: Progressing Pt receiving 6 mg IV morphine every 1-2 hours.  C/O migraine this afternoon, not helped by morphine.  Trying cold therapy. Goal: OOB as tolerated unless otherwise ordered Outcome: Progressing Able to amb full lap in hall, but requiring encouragement due to discouragement about frequent pain reoccurrence.

## 2015-04-24 ENCOUNTER — Encounter (HOSPITAL_COMMUNITY): Payer: Self-pay | Admitting: Internal Medicine

## 2015-04-24 ENCOUNTER — Inpatient Hospital Stay (HOSPITAL_COMMUNITY): Payer: BLUE CROSS/BLUE SHIELD

## 2015-04-24 DIAGNOSIS — K289 Gastrojejunal ulcer, unspecified as acute or chronic, without hemorrhage or perforation: Principal | ICD-10-CM

## 2015-04-24 DIAGNOSIS — F909 Attention-deficit hyperactivity disorder, unspecified type: Secondary | ICD-10-CM

## 2015-04-24 DIAGNOSIS — R51 Headache: Secondary | ICD-10-CM

## 2015-04-24 DIAGNOSIS — G44211 Episodic tension-type headache, intractable: Secondary | ICD-10-CM

## 2015-04-24 LAB — CBC WITH DIFFERENTIAL/PLATELET
BASOS ABS: 0 10*3/uL (ref 0.0–0.1)
Basophils Relative: 0 % (ref 0–1)
Eosinophils Absolute: 0.1 10*3/uL (ref 0.0–0.7)
Eosinophils Relative: 1 % (ref 0–5)
HCT: 30.9 % — ABNORMAL LOW (ref 36.0–46.0)
Hemoglobin: 9.7 g/dL — ABNORMAL LOW (ref 12.0–15.0)
LYMPHS PCT: 30 % (ref 12–46)
Lymphs Abs: 2 10*3/uL (ref 0.7–4.0)
MCH: 27.7 pg (ref 26.0–34.0)
MCHC: 31.4 g/dL (ref 30.0–36.0)
MCV: 88.3 fL (ref 78.0–100.0)
MONO ABS: 0.7 10*3/uL (ref 0.1–1.0)
MONOS PCT: 11 % (ref 3–12)
NEUTROS ABS: 3.8 10*3/uL (ref 1.7–7.7)
NEUTROS PCT: 58 % (ref 43–77)
PLATELETS: 242 10*3/uL (ref 150–400)
RBC: 3.5 MIL/uL — ABNORMAL LOW (ref 3.87–5.11)
RDW: 14.3 % (ref 11.5–15.5)
WBC: 6.7 10*3/uL (ref 4.0–10.5)

## 2015-04-24 MED ORDER — PANTOPRAZOLE SODIUM 40 MG PO TBEC: 40.0000 mg | DELAYED_RELEASE_TABLET | Freq: Every day | ORAL | Status: DC

## 2015-04-24 MED ORDER — SODIUM CHLORIDE 0.9 % IV BOLUS (SEPSIS): 500.0000 mL | INTRAVENOUS | Status: DC | PRN

## 2015-04-24 MED ORDER — PANTOPRAZOLE SODIUM 40 MG PO TBEC: 40.0000 mg | DELAYED_RELEASE_TABLET | Freq: Two times a day (BID) | ORAL | Status: DC

## 2015-04-24 MED ORDER — KETOROLAC TROMETHAMINE 15 MG/ML IJ SOLN: 15.0000 mg | Freq: Once | INTRAMUSCULAR | Status: DC

## 2015-04-24 MED ORDER — SODIUM CHLORIDE 0.9 % IV BOLUS (SEPSIS)
500.0000 mL | Freq: Once | INTRAVENOUS | Status: AC
  Administered 2015-04-24: 500 mL via INTRAVENOUS

## 2015-04-24 MED ORDER — METHOCARBAMOL 1000 MG/10ML IJ SOLN
500.0000 mg | Freq: Four times a day (QID) | INTRAVENOUS | Status: DC | PRN
  Administered 2015-04-24 (×2): 500 mg via INTRAVENOUS
  Filled 2015-04-24 (×3): qty 5

## 2015-04-24 MED ORDER — LORAZEPAM 1 MG PO TABS
1.0000 mg | ORAL_TABLET | Freq: Every evening | ORAL | Status: DC | PRN
  Administered 2015-04-24 (×2): 1 mg via ORAL
  Filled 2015-04-24 (×2): qty 1

## 2015-04-24 MED ORDER — VALPROATE SODIUM 500 MG/5ML IV SOLN
500.0000 mg | Freq: Once | INTRAVENOUS | Status: AC
  Administered 2015-04-24: 500 mg via INTRAVENOUS
  Filled 2015-04-24: qty 5

## 2015-04-24 MED ORDER — DIPHENHYDRAMINE HCL 50 MG/ML IJ SOLN: 12.5000 mg | Freq: Once | INTRAMUSCULAR | Status: DC

## 2015-04-24 MED ORDER — METOCLOPRAMIDE HCL 5 MG/ML IJ SOLN
10.0000 mg | Freq: Once | INTRAMUSCULAR | Status: DC
Start: 2015-04-24 — End: 2015-04-24

## 2015-04-24 MED ORDER — CETYLPYRIDINIUM CHLORIDE 0.05 % MT LIQD
7.0000 mL | Freq: Two times a day (BID) | OROMUCOSAL | Status: DC
Start: 2015-04-24 — End: 2015-04-25

## 2015-04-24 MED ORDER — AMPHETAMINE-DEXTROAMPHETAMINE 10 MG PO TABS
30.0000 mg | ORAL_TABLET | Freq: Two times a day (BID) | ORAL | Status: DC
  Administered 2015-04-25: 30 mg via ORAL
  Filled 2015-04-24 (×2): qty 3

## 2015-04-24 NOTE — Progress Notes (Signed)
Patient ID: Erin Willis, female   DOB: 1981-02-20, 34 y.o.   MRN: [id number]  General Surgery [facility]  POD#: 2  Subjective: Patient just back from CT head for evaluation of HA.  Complains of persistent HA pain - hx of migraines.  Mild abdominal pain.  Taking water and ice chips.  Objective: Vital signs in last 24 hours: Temp:  [98.2 F (36.8 C)-99 F (37.2 C)] 98.2 F (36.8 C) (06/05 0808) Pulse Rate:  [60-93] 75 (06/05 0808) Resp:  [18] 18 (06/05 0618) BP: (101-131)/(60-83) 102/71 mmHg (06/05 0808) SpO2:  [100 %] 100 % (06/05 0618) Last BM Date: 04/21/15  Intake/Output from previous day: 06/04 0701 - 06/05 0700 In: 3520 [P.O.:120; I.V.:2900; IV Piggyback:500] Out: 5575 [Urine:5575] Intake/Output this shift:    Physical Exam: HEENT - sclerae clear, mucous membranes moist Chest - clear bilaterally Cor - RRR Abdomen - soft without distension; wounds dry and intact; BS present Ext - no edema, non-tender Neuro - alert & oriented, no focal deficits  Lab Results:   Recent Labs  04/23/15 0434 04/23/15 1547 04/24/15 0555  WBC 10.5  --  6.7  HGB 9.6* 10.2* 9.7*  HCT 30.5* 31.8* 30.9*  PLT 289  --  242   BMET  Recent Labs  04/23/15 2030  NA 140  K 3.4*  CL 108  CO2 28  GLUCOSE 111*  BUN 6  CREATININE 0.58  CALCIUM 7.8*   PT/INR No results for input(s): LABPROT, INR in the last 72 hours. Comprehensive Metabolic Panel:    Component Value Date/Time   NA 140 04/23/2015 2030   NA 140 04/12/2015 1504   K 3.4* 04/23/2015 2030   K 4.4 04/12/2015 1504   CL 108 04/23/2015 2030   CL 105 04/12/2015 1504   CO2 28 04/23/2015 2030   CO2 27 04/12/2015 1504   BUN 6 04/23/2015 2030   BUN 11 04/12/2015 1504   CREATININE 0.58 04/23/2015 2030   CREATININE 0.77 04/12/2015 1504   GLUCOSE 111* 04/23/2015 2030   GLUCOSE 93 04/12/2015 1504   CALCIUM 7.8* 04/23/2015 2030   CALCIUM 8.5* 04/12/2015 1504   AST 29 04/23/2015 2030   ALT 25  04/23/2015 2030   ALKPHOS 57 04/23/2015 2030   BILITOT 0.6 04/23/2015 2030   PROT 5.9* 04/23/2015 2030   ALBUMIN 3.6 04/23/2015 2030    Studies/Results: Dg Ugi W/water Sol Cm  04/23/2015   CLINICAL DATA:  One day postop from revision of gastrojejunostomy due to anastomotic ulcer. Previous gastric bypass surgery in 2011.  EXAM: WATER SOLUBLE UPPER GI SERIES  TECHNIQUE: Single-column upper GI series was performed using water soluble contrast.  CONTRAST:  50 mL Omnipaque 300  COMPARISON:  None.  FLUOROSCOPY TIME:  Fluoroscopy Time (in minutes and seconds): 1 minutes and 1 second  Number of Acquired Images:  10  FINDINGS: Esophagus:  No evidence of esophageal mass or stricture.  Stomach: Expected postoperative appearance of gastric pouch is seen. Prompt contrast emptying from the gastric pouch is demonstrated. There is no evidence of contrast leak or extravasation.  Small Bowel: Efferent small bowel shows no evidence of dilatation or obstruction. Jejunal fold pattern is within normal limits. There is no evidence of contrast leak or extravasation.  Other:  None.  IMPRESSION: Expected postop appearance status post gastric bypass surgery. No evidence of anastomotic leak, obstruction, or other complication.   Electronically Signed   By: Myles Rosenthal M.D.   On: 04/23/2015 12:48  Anti-infectives: Anti-infectives    Start     Dose/Rate Route Frequency Ordered Stop   04/22/15 1046  cefOXitin (MEFOXIN) 2 g in dextrose 5 % 50 mL IVPB     2 g 100 mL/hr over 30 Minutes Intravenous On call to O.R. 04/22/15 1046 04/22/15 1511      Assessment & Plans: Status post revision gastrojejunostomy  Advance to clear liquid diet per protocol Migraine HA  Appreciate [facility] management  Will check CT scan results  Velora Hecklerodd M. Kenedy Haisley, MD, [facility] Office: [phone]   Deziya Amero Judie PetitM 04/24/2015

## 2015-04-24 NOTE — Progress Notes (Signed)
[facility] CONSULT progress not                                                                              Patient Demographics  Erin Willis, is a 34 y.o. female, DOB - [birth year], LKG:[id number]  Admit date - 04/22/2015   Admitting Physician Glenna FellowsBenjamin Hoxworth, MD  Outpatient Primary MD for the patient is FULK, Lacie DraftALEXIS N, MD  LOS - 2   No chief complaint on file.      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 Creation of a new one. Patient has hx of migraine headaches usually relieved at home by Imitrex. Consult called for on going headache for the past 2 days that is partially relieved by Ativan and Narcotic pain medication but not Imitrex. Patient reported that pain is different from her usual migraine headache. not associated nausea and vomiting minimal photophobia. The pain is on the back of her head and radiating to her shoulders. Feels markedly tension headache. Patient endorsed heavy caffeine use but has not been able to have any while hospitalized. She states that the headache is worse when she tries status stand up and ambulate and relieved by rest although only partial. patient takes Ativan at home at least once a day. Haven't had Ativan and Adderall restarted while hospitalized as patient has been NPO. Hospitalist service consulted for headache  Assessment & Plan    Active Problems:   Anastomotic ulcer S/P gastric bypass Per surgery, primary service  Headache: Multifactorial likely migraine versus due to withdrawal from the medications, Ativan, Adderall, caffeine withdrawal - CT of the head negative for Acute intracranial pathology/any hemorrhage - No significant improvement with Ativan or narcotics - Unable to give NSAIDs due to Ulcer disease, symptomatic, undergone gastrojejunostomy and a new anastomosis  -  Discussed with neurology, Dr. Roseanne RenoStewart, recommended Depacon IV x1 with muscle relaxant/Robaxin   Code Status: Full code  Family Communication: Discussed in detail with the patient, all imaging results, lab results explained to the patient    Disposition Plan: Per general surgery   Time Spent in minutes   25 minutes   DVT Prophylaxis  heparin   Medications  Scheduled Meds: . amphetamine-dextroamphetamine  30 mg Oral BID WC  . antiseptic oral rinse  7 mL Mouth Rinse BID  . heparin subcutaneous  5,000 Units Subcutaneous 3 times per day  . protein supplement  2 oz Oral QID   Or  . protein supplement  2 oz Oral QID   Or  . protein supplement  2 oz Oral QID   Continuous Infusions: . dextrose 5 % and 0.9 % NaCl with KCl 20 mEq/L 100 mL/hr at 04/24/15 1009   PRN Meds:.oxyCODONE **AND** acetaminophen, acetaminophen (TYLENOL) oral liquid 160 mg/5 mL, LORazepam, methocarbamol (ROBAXIN)  IV, morphine injection, ondansetron (ZOFRAN) IV, sodium chloride, SUMAtriptan   Antibiotics   Anti-infectives    Start     Dose/Rate Route Frequency Ordered Stop   04/22/15 1046  cefOXitin (MEFOXIN) 2 g in dextrose 5 % 50  mL IVPB     2 g 100 mL/hr over 30 Minutes Intravenous On call to O.R. 04/22/15 1046 04/22/15 1511        Subjective:   Erin Willis was seen and examined today. Complaining of headache, 10/10, all over the head, associated with nausea and photophobia, no blurry vision or any focal weakness. The patient denies dizziness, chest pain, shortness of breath, new weakness, numbess, tingling. No acute events overnight.    Objective:   Blood pressure 119/78, pulse 75, temperature 98.2 F (36.8 C), temperature source Oral, resp. rate 18, height  (1.778 m), weight 79.379 kg (175 lb), SpO2 100 %.  Wt Readings from Last 3 Encounters:  04/22/15 79.379 kg (175 lb)     Intake/Output Summary (Last 24 hours) at 04/24/15 1124 Last data filed at 04/24/15 0600  Gross per 24 hour    Intake   2220 ml  Output   5075 ml  Net  -2855 ml    Exam  General: Alert and oriented x 3, NAD,  HEENT:  PERRLA, EOMI, Anicteric Sclera, mucous membranes moist.   Neck: Supple, no JVD, no masses  CVS: S1 S2 auscultated, no rubs, murmurs or gallops. Regular rate and rhythm.  Respiratory: Clear to auscultation bilaterally, no wheezing, rales or rhonchi  Abdomen: Soft, nontender, nondistended, + bowel sounds  Ext: no cyanosis clubbing or edema  Neuro: AAOx3, Cr N's II- XII. Strength 5/5 upper and lower extremities bilaterally  Skin: No rashes  Psych: Normal affect and demeanor, alert and oriented x3    Data Review   Micro Results No results found for this or any previous visit (from the past 240 hour(s)).  Radiology Reports Ct Head Wo Contrast  04/24/2015   CLINICAL DATA:  Headache for 2 days.  EXAM: CT HEAD WITHOUT CONTRAST  TECHNIQUE: Contiguous axial images were obtained from the base of the skull through the vertex without intravenous contrast.  COMPARISON:  None.  FINDINGS: No acute intracranial hemorrhage. No focal mass lesion. No CT evidence of acute infarction. No midline shift or mass effect. No hydrocephalus. Basilar cisterns are patent. Dystrophic calcifications within the lentiform nuclei.  IMPRESSION: 1. No acute intracranial findings. 2. Dystrophic calcifications in the basal ganglia.   Electronically Signed   By: Genevive Bi M.D.   On: 04/24/2015 09:10   Dg Kayleen Memos W/water Sol Cm  04/23/2015   CLINICAL DATA:  One day postop from revision of gastrojejunostomy due to anastomotic ulcer. Previous gastric bypass surgery in 2011.  EXAM: WATER SOLUBLE UPPER GI SERIES  TECHNIQUE: Single-column upper GI series was performed using water soluble contrast.  CONTRAST:  50 mL Omnipaque 300  COMPARISON:  None.  FLUOROSCOPY TIME:  Fluoroscopy Time (in minutes and seconds): 1 minutes and 1 second  Number of Acquired Images:  10  FINDINGS: Esophagus:  No evidence of esophageal mass  or stricture.  Stomach: Expected postoperative appearance of gastric pouch is seen. Prompt contrast emptying from the gastric pouch is demonstrated. There is no evidence of contrast leak or extravasation.  Small Bowel: Efferent small bowel shows no evidence of dilatation or obstruction. Jejunal fold pattern is within normal limits. There is no evidence of contrast leak or extravasation.  Other:  None.  IMPRESSION: Expected postop appearance status post gastric bypass surgery. No evidence of anastomotic leak, obstruction, or other complication.   Electronically Signed   By: Myles Rosenthal M.D.   On: 04/23/2015 12:48    CBC  Recent Labs Lab 04/22/15 2117 04/23/15  0981 04/23/15 1547 04/24/15 0555  WBC  --  10.5  --  6.7  HGB 10.4* 9.6* 10.2* 9.7*  HCT 32.8* 30.5* 31.8* 30.9*  PLT  --  289  --  242  MCV  --  85.4  --  88.3  MCH  --  26.9  --  27.7  MCHC  --  31.5  --  31.4  RDW  --  14.0  --  14.3  LYMPHSABS  --  1.4  --  2.0  MONOABS  --  1.1*  --  0.7  EOSABS  --  0.0  --  0.1  BASOSABS  --  0.0  --  0.0    Chemistries   Recent Labs Lab 04/23/15 2030  NA 140  K 3.4*  CL 108  CO2 28  GLUCOSE 111*  BUN 6  CREATININE 0.58  CALCIUM 7.8*  AST 29  ALT 25  ALKPHOS 57  BILITOT 0.6   ------------------------------------------------------------------------------------------------------------------ estimated creatinine clearance is 107.2 mL/min (by C-G formula based on Cr of 0.58). ------------------------------------------------------------------------------------------------------------------ No results for input(s): HGBA1C in the last 72 hours. ------------------------------------------------------------------------------------------------------------------ No results for input(s): CHOL, HDL, LDLCALC, TRIG, CHOLHDL, LDLDIRECT in the last 72 hours. ------------------------------------------------------------------------------------------------------------------ No results for  input(s): TSH, T4TOTAL, T3FREE, THYROIDAB in the last 72 hours.  Invalid input(s): FREET3 ------------------------------------------------------------------------------------------------------------------ No results for input(s): VITAMINB12, FOLATE, FERRITIN, TIBC, IRON, RETICCTPCT in the last 72 hours.  Coagulation profile No results for input(s): INR, PROTIME in the last 168 hours.  No results for input(s): DDIMER in the last 72 hours.  Cardiac Enzymes No results for input(s): CKMB, TROPONINI, MYOGLOBIN in the last 168 hours.  Invalid input(s): CK ------------------------------------------------------------------------------------------------------------------ Invalid input(s): POCBNP  No results for input(s): GLUCAP in the last 72 hours.   RAI,RIPUDEEP M.D. [facility] 04/24/2015, 11:24 AM  Pager: [phone]   Between 7am to 7pm - call Pager - [phone]  After 7pm go to [email or web address] - password [email or web address]  Call night coverage person covering after 7pm

## 2015-04-24 NOTE — Plan of Care (Signed)
Problem: Food- and Nutrition-Related Knowledge Deficit (NB-1.1) Goal: Nutrition education Formal process to instruct or train a patient/client in a skill or to impart knowledge to help patients/clients voluntarily manage or modify food choices and eating behavior to maintain or improve health.  Outcome: Completed/Met Date Met:  04/24/15 Nutrition Education Note  Received consult for diet education per DROP protocol. Hx of Roux en Y in 2011.  S/p Procedure(s): LAPAROSCOPIC RESECTION OF GASTROJEJUNOSTOMY with creation of new gastrojejunostomy UPPER GI ENDOSCOPY  Reviewed the following with patient as well as provided handouts on Bariatric protein shake options and a price sheet for supplements available at [facility] outpatient pharmacy.  Discussed 2 week post op diet with pt. Emphasized that liquids must be non carbonated, non caffeinated, and sugar free. Fluid goals discussed. Pt to follow up with outpatient bariatric RD for further diet progression after 2 weeks. Multivitamins and minerals also reviewed. Teach back method used, pt expressed understanding, expect good compliance.   Diet: First 2 Weeks  You will see the nutritionist about two (2) weeks after your surgery. The nutritionist will increase the types of foods you can eat if you are handling liquids well:  If you have severe vomiting or nausea and cannot handle clear liquids lasting longer than 1 day, call your surgeon  Protein Shake  Drink at least 2 ounces of shake 5-6 times per day  Each serving of protein shakes (usually 8 - 12 ounces) should have a minimum of:  15 grams of protein  And no more than 5 grams of carbohydrate  Goal for protein each day:  Men = 80 grams per day  Women = 60 grams per day  Protein powder may be added to fluids such as non-fat milk or Lactaid milk or Soy milk (limit to 35 grams added protein powder per serving)   Hydration  Slowly increase the amount of water and other clear liquids as tolerated (See  Acceptable Fluids)  Slowly increase the amount of protein shake as tolerated  Sip fluids slowly and throughout the day  May use sugar substitutes in small amounts (no more than 6 - 8 packets per day; i.e. Splenda)   Fluid Goal  The first goal is to drink at least 8 ounces of protein shake/drink per day (or as directed by the nutritionist); some examples of protein shakes are [employer], [employer], EAS Edge HP, and Unjury. See handout from pre-op Bariatric Education Class:  Slowly increase the amount of protein shake you drink as tolerated  You may find it easier to slowly sip shakes throughout the day  It is important to get your proteins in first  Your fluid goal is to drink 64 - 100 ounces of fluid daily  It may take a few weeks to build up to this  32 oz (or more) should be clear liquids  And  32 oz (or more) should be full liquids (see below for examples)  Liquids should not contain sugar, caffeine, or carbonation   Clear Liquids:  Water or Sugar-free flavored water (i.e. Fruit H2O, Propel)  Decaffeinated coffee or tea (sugar-free)  Crystal Lite, Wyler's Lite, Minute Maid Lite  Sugar-free Jell-O  Bouillon or broth  Sugar-free Popsicle: *Less than 20 calories each; Limit 1 per day   Full Liquids:  Protein Shakes/Drinks + 2 choices per day of other full liquids  Full liquids must be:  No More Than 12 grams of Carbs per serving  No More Than 3 grams of Fat per serving  Strained low-fat cream soup  Non-Fat milk  Fat-free Lactaid Milk  Sugar-free yogurt (Dannon Lite & Fit, Greek yogurt)     Clayton Bibles, MS, RD, LDN Pager: [phone] After Hours Pager: [phone]

## 2015-04-24 NOTE — H&P (Addendum)
PCP: Alease Medina, MD    Consulting provider [place]   Reason for consult: Headache  HPI: Erin Willis is a 34 y.o. female   has a past medical history of Complication of anesthesia; PONV (postoperative nausea and vomiting); Bronchitis; Pneumonia; Urinary tract infection; Kidney stone; GERD (gastroesophageal reflux disease); Headache; Iron deficiency anemia; Attention deficit disorder; and Gastric ulcer.   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 Creation of a new one. Patient has hx of migraine headaches usually relieved at home by Imitrex. Consult called for on going headache for the past 2 days that is partially relieved by Ativan and  Narcotic pain medication but not Imitrex. Patient reports that pain is different from her usual migraine headache.  not associated nausea and vomiting minimal photophobia. The pain is on the back of her head and radiating to her shoulders. Feels markedly tension headache. Patient endorses heavy caffeine use but has not been able to have any while hospitalized. She states that the headache is worse when she tries status stand up and ambulate and relieved by rest although only partial.  patient takes Ativan at home at least once a day. Haven't had Ativan restarted while hospitalized. Also usually in Adderall this also has not been restarted. Secondary to patient being nothing by mouth.  Hospitalist was calInsult for headache management Review of Systems:    Pertinent positives include: headache irritability  titutional:  No weight loss, night sweats, Fevers, chills, fatigue, weight loss  HEENT:    Difficulty swallowing,Tooth/dental problems,Sore throat,  No sneezing, itching, ear ache, nasal congestion, post nasal drip,  Cardio-vascular:  No chest pain, Orthopnea, PND, anasarca, dizziness, palpitations.no Bilateral lower  extremity swelling  GI:  No heartburn, indigestion, abdominal pain, nausea, vomiting, diarrhea, change in bowel habits, loss of appetite, melena, blood in stool, hematemesis Resp:  no shortness of breath at rest. No dyspnea on exertion, No excess mucus, no productive cough, No non-productive cough, No coughing up of blood.No change in color of mucus.No wheezing. Skin:  no rash or lesions. No jaundice GU:  no dysuria, change in color of urine, no urgency or frequency. No straining to urinate.  No flank pain.  Musculoskeletal:  No joint pain or no joint swelling. No decreased range of motion. No back pain.  Psych:  No change in mood or affect. No depression or anxiety. No memory loss.  Neuro: no localizing neurological complaints, no tingling, no weakness, no double vision, no gait abnormality, no slurred speech, no confusion  Otherwise ROS are negative except for above, 10 systems were reviewed  Past Medical History: Past Medical History  Diagnosis Date  . Complication of anesthesia     has awaken during wisdom teeth extraction   . PONV (postoperative nausea and vomiting)   . Bronchitis     hx of   . Pneumonia     hx of infancy   . Urinary tract infection     hx of   . Kidney stone     hx of per left   . GERD (gastroesophageal reflux disease)   . Headache     migraines has prescription med to use for relief  . Iron deficiency anemia   . Attention deficit disorder   . Gastric ulcer    Past Surgical History  Procedure Laterality Date  . Wisdom tooth extraction    . Upper gi endoscopy    .  Cholecystectomy    . Tonsillectomy      T&A age 89  . Tummy tuck     . Gastric bypass      2011   . Breast surgery      lift / implant 2011  . Uterine ablation     . Tympanostomy tube placement      bilat   . Intussusception repair      08/2014      Medications: Prior to Admission medications   Medication Sig Start Date End Date Taking? Authorizing Provider  acetaminophen  (TYLENOL) 500 MG tablet Take 1,000 mg by mouth every 6 (six) hours as needed for moderate pain or headache.   Yes Historical Provider, MD  amphetamine-dextroamphetamine (ADDERALL) 30 MG tablet Take 30 mg by mouth 3 (three) times daily.   Yes Historical Provider, MD  Calcium & Magnesium Carbonates (MYLANTA PO) Take 30 mLs by mouth 3 (three) times daily as needed (heart burn.).   Yes Historical Provider, MD  dexlansoprazole (DEXILANT) 60 MG capsule Take 60 mg by mouth every morning.   Yes Historical Provider, MD  fluticasone (FLONASE) 50 MCG/ACT nasal spray Place 1 spray into both nostrils daily as needed for allergies or rhinitis.   Yes Historical Provider, MD  LORazepam (ATIVAN) 1 MG tablet Take 1 mg by mouth at bedtime as needed for sleep.   Yes Historical Provider, MD  Multiple Vitamins-Minerals (MULTIVITAMIN GUMMIES ADULT PO) Take 5 each by mouth every morning.   Yes Historical Provider, MD  ranitidine (ZANTAC) 150 MG capsule Take 600-900 mg by mouth 2 (two) times daily as needed for heartburn.   Yes Historical Provider, MD  sucralfate (CARAFATE) 1 G tablet Take 1 g by mouth 4 (four) times daily -  with meals and at bedtime.   Yes Historical Provider, MD  SUMAtriptan (IMITREX) 100 MG tablet Take 100 mg by mouth every 2 (two) hours as needed for migraine. May repeat in 2 hours if headache persists or recurs.   Yes Historical Provider, MD    Allergies:   Allergies  Allergen Reactions  . Vancomycin Hives and Itching    "crawling out of skin"  . Phenergan [Promethazine Hcl] Other (See Comments)    Visual and Auditory Hallucinations.     Social History:  Ambulatory   independently   Lives at home alone,       reports that she has never smoked. She has never used smokeless tobacco. She reports that she drinks alcohol. She reports that she does not use illicit drugs.    Family History: family history includes Hypertension in her mother.    Physical Exam: Patient Vitals for the past 24  hrs:  BP Temp Temp src Pulse Resp SpO2  04/23/15 2154 116/75 mmHg 98.2 F (36.8 C) Oral 88 18 100 %  04/23/15 1823 110/73 mmHg 99 F (37.2 C) Oral 77 18 100 %  04/23/15 1357 116/60 mmHg 98.2 F (36.8 C) Oral 66 18 100 %  04/23/15 1144 131/73 mmHg 98.5 F (36.9 C) Oral 60 18 100 %  04/23/15 0510 124/66 mmHg 98.5 F (36.9 C) Oral (!) 57 15 100 %    1. General:  in No Acute distress 2. Psychological: Alert and  Oriented 3. Head/ENT:    Dry Mucous Membranes                          Head Non traumatic, neck supple  Normal  Dentition 4. SKIN: normal  Skin turgor,  Skin clean Dry and intact no rash 5. Heart: Regular rate and rhythm no Murmur, Rub or gallop 6. Lungs: Clear to auscultation bilaterally, no wheezes or crackles   7. Abdomen: SoftAppropriately postoperatively tender, Non distended 8. Lower extremities: no clubbing, cyanosis, or edema 9. Neurologically Grossly intact, moving all 4 extremities equally 10. MSK: Normal range of motion, tension noted in the left side of the scapula rhomboid muscle involvement.  body mass index is 25.11 kg/(m^2).   Labs on Admission:   Results for orders placed or performed during the hospital encounter of 04/22/15 (from the past 24 hour(s))  CBC WITH DIFFERENTIAL     Status: Abnormal   Collection Time: 04/23/15  4:34 AM  Result Value Ref Range   WBC 10.5 4.0 - 10.5 K/uL   RBC 3.57 (L) 3.87 - 5.11 MIL/uL   Hemoglobin 9.6 (L) 12.0 - 15.0 g/dL   HCT 16.1 (L) 09.6 - 04.5 %   MCV 85.4 78.0 - 100.0 fL   MCH 26.9 26.0 - 34.0 pg   MCHC 31.5 30.0 - 36.0 g/dL   RDW 40.9 81.1 - 91.4 %   Platelets 289 150 - 400 K/uL   Neutrophils Relative % 77 43 - 77 %   Neutro Abs 8.1 (H) 1.7 - 7.7 K/uL   Lymphocytes Relative 13 12 - 46 %   Lymphs Abs 1.4 0.7 - 4.0 K/uL   Monocytes Relative 10 3 - 12 %   Monocytes Absolute 1.1 (H) 0.1 - 1.0 K/uL   Eosinophils Relative 0 0 - 5 %   Eosinophils Absolute 0.0 0.0 - 0.7 K/uL   Basophils  Relative 0 0 - 1 %   Basophils Absolute 0.0 0.0 - 0.1 K/uL  Hemoglobin and hematocrit, blood     Status: Abnormal   Collection Time: 04/23/15  3:47 PM  Result Value Ref Range   Hemoglobin 10.2 (L) 12.0 - 15.0 g/dL   HCT 78.2 (L) 95.6 - 21.3 %  Comprehensive metabolic panel     Status: Abnormal   Collection Time: 04/23/15  8:30 PM  Result Value Ref Range   Sodium 140 135 - 145 mmol/L   Potassium 3.4 (L) 3.5 - 5.1 mmol/L   Chloride 108 101 - 111 mmol/L   CO2 28 22 - 32 mmol/L   Glucose, Bld 111 (H) 65 - 99 mg/dL   BUN 6 6 - 20 mg/dL   Creatinine, Ser 0.86 0.44 - 1.00 mg/dL   Calcium 7.8 (L) 8.9 - 10.3 mg/dL   Total Protein 5.9 (L) 6.5 - 8.1 g/dL   Albumin 3.6 3.5 - 5.0 g/dL   AST 29 15 - 41 U/L   ALT 25 14 - 54 U/L   Alkaline Phosphatase 57 38 - 126 U/L   Total Bilirubin 0.6 0.3 - 1.2 mg/dL   GFR calc non Af Amer >60 >60 mL/min   GFR calc Af Amer >60 >60 mL/min   Anion gap 4 (L) 5 - 15     No results found for: HGBA1C  Estimated Creatinine Clearance: 107.2 mL/min (by C-G formula based on Cr of 0.58).  BNP (last 3 results) No results for input(s): PROBNP in the last 8760 hours.   Filed Weights   04/22/15 1048  Weight: 79.379 kg (175 lb)     Cultures: No results found for: SDES, SPECREQUEST, CULT, REPTSTATUS   Radiological Exams on Admission: Dg Ugi W/water Sol Cm  04/23/2015   CLINICAL  DATA:  One day postop from revision of gastrojejunostomy due to anastomotic ulcer. Previous gastric bypass surgery in 2011.  EXAM: WATER SOLUBLE UPPER GI SERIES  TECHNIQUE: Single-column upper GI series was performed using water soluble contrast.  CONTRAST:  50 mL Omnipaque 300  COMPARISON:  None.  FLUOROSCOPY TIME:  Fluoroscopy Time (in minutes and seconds): 1 minutes and 1 second  Number of Acquired Images:  10  FINDINGS: Esophagus:  No evidence of esophageal mass or stricture.  Stomach: Expected postoperative appearance of gastric pouch is seen. Prompt contrast emptying from the gastric  pouch is demonstrated. There is no evidence of contrast leak or extravasation.  Small Bowel: Efferent small bowel shows no evidence of dilatation or obstruction. Jejunal fold pattern is within normal limits. There is no evidence of contrast leak or extravasation.  Other:  None.  IMPRESSION: Expected postop appearance status post gastric bypass surgery. No evidence of anastomotic leak, obstruction, or other complication.   Electronically Signed   By: Myles RosenthalJohn  Willis M.D.   On: 04/23/2015 12:48    Chart has been reviewed    Assessment/Plan   34 year old female with history of attention deficit disorder on chronic Adderall and migraine headaches was admitted for gastric bypass revision started to have significant headaches while not taking her regularly scheduled medications including Adderall and Ativan as well as decreasing her caffeine intake.   headache  - multifactorial less likely migraine mole likely tension versus induced by with draw from medications. Will restart home dose of Ativan as well as Adderall to prevent withdrawal which could complicate hospital stay. Check orthostatics to make sure patient was fluid resuscitated appropriately since she has been nothing by mouth. Give IV fluids. Patient appears to be neurologically intact. No clap onset of worrisome signs associated with headache. His evidence of muscular tension all pointing to benign causes a headache.   thank you for this consult please call with questions  Other plan as per orders.  I have spent a total of 55 min on this consult  Melek Pownall 04/24/2015, 2:28 AM  [facility]  Pager [phone]   after 2 AM please page floor coverage PA If 7AM-7PM, please contact the day team taking care of the patient  [email or web address]  Password [email or web address]

## 2015-04-25 ENCOUNTER — Encounter (HOSPITAL_COMMUNITY): Payer: Self-pay | Admitting: General Surgery

## 2015-04-25 MED ORDER — OXYCODONE HCL 5 MG/5ML PO SOLN: 5.0000 mg | ORAL | Status: DC | PRN

## 2015-04-25 NOTE — Progress Notes (Signed)
[facility] CONSULT progress not                                                                              Patient Demographics  Erin Willis, is a 34 y.o. female, DOB - 1980-12-12, ZOX:[id number]  Admit date - 04/22/2015   Admitting Physician Glenna FellowsBenjamin Hoxworth, MD  Outpatient Primary MD for the patient is FULK, Lacie DraftALEXIS N, MD  LOS - 3   No chief complaint on file.      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 Creation of a new one. Patient has hx of migraine headaches usually relieved at home by Imitrex. Consult called for on going headache for the past 2 days that is partially relieved by Ativan and Narcotic pain medication but not Imitrex. Patient reported that pain is different from her usual migraine headache. not associated nausea and vomiting minimal photophobia. The pain is on the back of her head and radiating to her shoulders. Feels markedly tension headache. Patient endorsed heavy caffeine use but has not been able to have any while hospitalized. She states that the headache is worse when she tries status stand up and ambulate and relieved by rest although only partial. patient takes Ativan at home at least once a day. Haven't had Ativan and Adderall restarted while hospitalized as patient has been NPO. Hospitalist service consulted for headache  Assessment & Plan    Active Problems:   Anastomotic ulcer S/P gastric bypass Per surgery, primary service  Headache: Multifactorial likely migraine versus due to withdrawal from the medications, Ativan, Adderall, caffeine withdrawal: RESOLVED  - CT of the head was negative for Acute intracranial pathology/any hemorrhage - Unable to give NSAIDs due to Ulcer disease, symptomatic, undergone gastrojejunostomy and new anastomosis  - Discussed with neurology, Dr. Roseanne RenoStewart  yesterday, gave Depacon IV x1 with Robaxin with significant improvement.  - Recommended to see neurologist outpatient if headaches/migraines are frequent, may need to be on maintenance therapy like topamax etc.      Code Status: Full code  Family Communication: Discussed in detail with the patient, all imaging results, lab results explained to the patient.     Disposition Plan: Per general surgery. Patient has no headache or any acute medical issues at this point. I will sign off. Please call me if there are any questions.     Time Spent in minutes   25 minutes   DVT Prophylaxis  heparin   Medications  Scheduled Meds: . amphetamine-dextroamphetamine  30 mg Oral BID WC  . antiseptic oral rinse  7 mL Mouth Rinse BID  . heparin subcutaneous  5,000 Units Subcutaneous 3 times per day  . protein supplement  2 oz Oral QID   Or  . protein supplement  2 oz Oral QID   Or  . protein supplement  2 oz Oral QID   Continuous Infusions: . dextrose 5 % and 0.9 % NaCl with KCl 20 mEq/L 100 mL/hr at 04/24/15 1009   PRN Meds:.oxyCODONE **AND** acetaminophen, acetaminophen (TYLENOL) oral liquid 160  mg/5 mL, LORazepam, methocarbamol (ROBAXIN)  IV, morphine injection, ondansetron (ZOFRAN) IV, sodium chloride, SUMAtriptan   Antibiotics   Anti-infectives    Start     Dose/Rate Route Frequency Ordered Stop   04/22/15 1046  cefOXitin (MEFOXIN) 2 g in dextrose 5 % 50 mL IVPB     2 g 100 mL/hr over 30 Minutes Intravenous On call to O.R. 04/22/15 1046 04/22/15 1511        Subjective:   Erin Retort was seen and examined today. No headache today, feeling good, hoping to go home soon. Patient denies dizziness, chest pain, shortness of breath, new weakness, numbess, tingling. No acute events overnight.    Objective:   Blood pressure 98/48, pulse 78, temperature 98.8 F (37.1 C), temperature source Oral, resp. rate 18, height  (1.778 m), weight 79.379 kg (175 lb), SpO2 100 %.  Wt Readings  from Last 3 Encounters:  04/22/15 79.379 kg (175 lb)     Intake/Output Summary (Last 24 hours) at 04/25/15 0847 Last data filed at 04/25/15 0600  Gross per 24 hour  Intake   2910 ml  Output   3300 ml  Net   -390 ml    Exam  General: Alert and oriented x 3, NAD,  HEENT:  PERRLA, EOMI  Neck: Supple, no JVD  CVS: S1 S2 clear, RRR  Respiratory: CTAB  Abdomen: Soft, nontender, nondistended, + bowel sounds  Ext: no cyanosis clubbing or edema  Neuro: no new deficits  Skin: No rashes  Psych: Normal affect and demeanor, alert and oriented x3    Data Review   Micro Results No results found for this or any previous visit (from the past 240 hour(s)).  Radiology Reports Ct Head Wo Contrast  04/24/2015   CLINICAL DATA:  Headache for 2 days.  EXAM: CT HEAD WITHOUT CONTRAST  TECHNIQUE: Contiguous axial images were obtained from the base of the skull through the vertex without intravenous contrast.  COMPARISON:  None.  FINDINGS: No acute intracranial hemorrhage. No focal mass lesion. No CT evidence of acute infarction. No midline shift or mass effect. No hydrocephalus. Basilar cisterns are patent. Dystrophic calcifications within the lentiform nuclei.  IMPRESSION: 1. No acute intracranial findings. 2. Dystrophic calcifications in the basal ganglia.   Electronically Signed   By: Genevive Bi M.D.   On: 04/24/2015 09:10   Dg Kayleen Memos W/water Sol Cm  04/23/2015   CLINICAL DATA:  One day postop from revision of gastrojejunostomy due to anastomotic ulcer. Previous gastric bypass surgery in 2011.  EXAM: WATER SOLUBLE UPPER GI SERIES  TECHNIQUE: Single-column upper GI series was performed using water soluble contrast.  CONTRAST:  50 mL Omnipaque 300  COMPARISON:  None.  FLUOROSCOPY TIME:  Fluoroscopy Time (in minutes and seconds): 1 minutes and 1 second  Number of Acquired Images:  10  FINDINGS: Esophagus:  No evidence of esophageal mass or stricture.  Stomach: Expected postoperative appearance of  gastric pouch is seen. Prompt contrast emptying from the gastric pouch is demonstrated. There is no evidence of contrast leak or extravasation.  Small Bowel: Efferent small bowel shows no evidence of dilatation or obstruction. Jejunal fold pattern is within normal limits. There is no evidence of contrast leak or extravasation.  Other:  None.  IMPRESSION: Expected postop appearance status post gastric bypass surgery. No evidence of anastomotic leak, obstruction, or other complication.   Electronically Signed   By: Myles Rosenthal M.D.   On: 04/23/2015 12:48    CBC  Recent Labs  Lab 04/22/15 2117 04/23/15 0434 04/23/15 1547 04/24/15 0555  WBC  --  10.5  --  6.7  HGB 10.4* 9.6* 10.2* 9.7*  HCT 32.8* 30.5* 31.8* 30.9*  PLT  --  289  --  242  MCV  --  85.4  --  88.3  MCH  --  26.9  --  27.7  MCHC  --  31.5  --  31.4  RDW  --  14.0  --  14.3  LYMPHSABS  --  1.4  --  2.0  MONOABS  --  1.1*  --  0.7  EOSABS  --  0.0  --  0.1  BASOSABS  --  0.0  --  0.0    Chemistries   Recent Labs Lab 04/23/15 2030  NA 140  K 3.4*  CL 108  CO2 28  GLUCOSE 111*  BUN 6  CREATININE 0.58  CALCIUM 7.8*  AST 29  ALT 25  ALKPHOS 57  BILITOT 0.6   ------------------------------------------------------------------------------------------------------------------ estimated creatinine clearance is 107.2 mL/min (by C-G formula based on Cr of 0.58). ------------------------------------------------------------------------------------------------------------------ No results for input(s): HGBA1C in the last 72 hours. ------------------------------------------------------------------------------------------------------------------ No results for input(s): CHOL, HDL, LDLCALC, TRIG, CHOLHDL, LDLDIRECT in the last 72 hours. ------------------------------------------------------------------------------------------------------------------ No results for input(s): TSH, T4TOTAL, T3FREE, THYROIDAB in the last 72  hours.  Invalid input(s): FREET3 ------------------------------------------------------------------------------------------------------------------ No results for input(s): VITAMINB12, FOLATE, FERRITIN, TIBC, IRON, RETICCTPCT in the last 72 hours.  Coagulation profile No results for input(s): INR, PROTIME in the last 168 hours.  No results for input(s): DDIMER in the last 72 hours.  Cardiac Enzymes No results for input(s): CKMB, TROPONINI, MYOGLOBIN in the last 168 hours.  Invalid input(s): CK ------------------------------------------------------------------------------------------------------------------ Invalid input(s): POCBNP  No results for input(s): GLUCAP in the last 72 hours.   RAI,RIPUDEEP M.D. [facility] 04/25/2015, 8:47 AM  Pager: [phone]   Between 7am to 7pm - call Pager - [phone]  After 7pm go to [email or web address] - password [email or web address]  Call night coverage person covering after 7pm

## 2015-04-25 NOTE — Discharge Summary (Signed)
Patient ID: Erin FinnerMandy L Willis [id number] 34 y.o. 06-23-1981  04/22/2015  Discharge date and time: 04/25/2015   Admitting Physician: Glenna FellowsHOXWORTH,Dennison Mcdaid T  Discharge Physician: Glenna FellowsHOXWORTH,Oyindamola Key T  Admission Diagnoses: marginal ulcer  RYGB  Discharge Diagnoses: same  Operations: Procedure(s): LAPAROSCOPIC RESECTION OF GASTROJEJUNOSTOMY UPPER GI ENDOSCOPY  Admission Condition: fair  Discharged Condition: good  Indication for Admission: she is status post [facility]oux[place] [place] [facility] [place] [facility] [place]. She developed epigastric abdominal pain this past fall and has had documented marginal ulcer at her gastrojejunal anastomosis that has been unresponsive to medical management persistent ulceration on recent upper endoscopy. Due to persistent markedly symptomatic marginal ulcer despite maximal medical management I recommended proceeding with resection of her gastrojejunostomy and she is admitted for this purpose.  Hospital Course: on the day of admission the patient underwent an uneventful laparoscopic resection of her distal pouch and gastro-jejunostomy with creation of a new gastrojejunostomy. On the first postoperative day she had some expected abdominal discomfort. Gastrografin swallow showed no leak or obstruction and she was started on clear liquids. She developed a severe headache on the second postop day with history of migraine headaches. Medicine service was consulted and CT scan of the head was negative. This was felt to be likely a migraine due to withdrawal from oral medications. She was treated with IV Imitrex with marked improvement. Diet was advanced to full liquids which she tolerated well. On the day of discharge she has no complaints. Feels ready to go home. Tolerating full liquid diet.  Consults: hospitalist medical service  Significant Diagnostic Studies: negative Gastrografin upper GI and negative CT scan of the head  Disposition:  Home  Patient Instructions:    Medication List    TAKE these medications        acetaminophen 500 MG tablet  Commonly known as:  TYLENOL  Take 1,000 mg by mouth every 6 (six) hours as needed for moderate pain or headache.     amphetamine-dextroamphetamine 30 MG tablet  Commonly known as:  ADDERALL  Take 30 mg by mouth 3 (three) times daily.     dexlansoprazole 60 MG capsule  Commonly known as:  DEXILANT  Take 60 mg by mouth every morning.     fluticasone 50 MCG/ACT nasal spray  Commonly known as:  FLONASE  Place 1 spray into both nostrils daily as needed for allergies or rhinitis.     LORazepam 1 MG tablet  Commonly known as:  ATIVAN  Take 1 mg by mouth at bedtime as needed for sleep.     MULTIVITAMIN GUMMIES ADULT PO  Take 5 each by mouth every morning.     MYLANTA PO  Take 30 mLs by mouth 3 (three) times daily as needed (heart burn.).     oxyCODONE 5 MG/5ML solution  Commonly known as:  ROXICODONE  Take 5-10 mLs (5-10 mg total) by mouth every 4 (four) hours as needed for moderate pain or severe pain.     ranitidine 150 MG capsule  Commonly known as:  ZANTAC  Take 600-900 mg by mouth 2 (two) times daily as needed for heartburn.     sucralfate 1 G tablet  Commonly known as:  CARAFATE  Take 1 g by mouth 4 (four) times daily -  with meals and at bedtime.     SUMAtriptan 100 MG tablet  Commonly known as:  IMITREX  Take 100 mg by mouth every 2 (two) hours as needed for migraine. May repeat in 2  hours if headache persists or recurs.        Activity: activity as tolerated Diet: bariatric full liquid diet Wound Care: none needed  Follow-up:  With Dr. Johna Sheriff in 3 weeks.  Signed: Mariella Saa MD, FACS  04/25/2015, 10:41 AM

## 2015-04-25 NOTE — Discharge Instructions (Signed)
Full liquid diet only for two weeks  [facility] ______[facility] LAPAROSCOPIC SURGERY: POST OP INSTRUCTIONS Always review your discharge instruction sheet given to you by the facility where your surgery was performed. IF YOU HAVE DISABILITY OR FAMILY LEAVE FORMS, YOU MUST BRING THEM TO THE OFFICE FOR PROCESSING.   DO NOT GIVE THEM TO YOUR DOCTOR.  1. A prescription for pain medication may be given to you upon discharge.  Take your pain medication as prescribed, if needed.  If narcotic pain medicine is not needed, then you may take acetaminophen (Tylenol) or ibuprofen (Advil) as needed. 2. Take your usually prescribed medications unless otherwise directed. 3. If you need a refill on your pain medication, please contact your pharmacy.  They will contact our office to request authorization. Prescriptions will not be filled after 5pm or on week-ends. 4. You should follow a light diet the first few days after arrival home, such as soup and crackers, etc.  Be sure to include lots of fluids daily. 5. Most patients will experience some swelling and bruising in the area of the incisions.  Ice packs will help.  Swelling and bruising can take several days to resolve.  6. It is common to experience some constipation if taking pain medication after surgery.  Increasing fluid intake and taking a stool softener (such as Colace) will usually help or prevent this problem from occurring.  A mild laxative (Milk of Magnesia or Miralax) should be taken according to package instructions if there are no bowel movements after 48 hours. 7. Unless discharge instructions indicate otherwise, you may remove your bandages 24-48 hours after surgery, and you may shower at that time.  You may have steri-strips (small skin tapes) in place directly over the incision.  These strips should be left on the skin for 7-10 days.  If your surgeon used skin glue on the incision, you may shower in 24 hours.  The glue will flake off  over the next 2-3 weeks.  Any sutures or staples will be removed at the office during your follow-up visit. 8. ACTIVITIES:  You may resume regular (light) daily activities beginning the next day--such as daily self-care, walking, climbing stairs--gradually increasing activities as tolerated.  You may have sexual intercourse when it is comfortable.  Refrain from any heavy lifting or straining until approved by your doctor. a. You may drive when you are no longer taking prescription pain medication, you can comfortably wear a seatbelt, and you can safely maneuver your car and apply brakes. b. RETURN TO WORK:  __________________________________________________________ 9. You should see your doctor in the office for a follow-up appointment approximately 2-3 weeks after your surgery.  Make sure that you call for this appointment within a day or two after you arrive home to insure a convenient appointment time. 10. OTHER INSTRUCTIONS: __________________________________________________________________________________________________________________________ __________________________________________________________________________________________________________________________ WHEN TO CALL YOUR DOCTOR: 1. Fever over 101.0 2. Inability to urinate 3. Continued bleeding from incision. 4. Increased pain, redness, or drainage from the incision. 5. Increasing abdominal pain  The clinic staff is available to answer your questions during regular business hours.  Please dont hesitate to call and ask to speak to one of the nurses for clinical concerns.  If you have a medical emergency, go to the nearest emergency room or call 911.  A surgeon from [facility] is always on call at the hospital. [address], [address], [place], [place]  [id number] ? [address] [address], [place], [place]   [id number] [phone] ?  [id number] ? FAX [phone] Web site: [email or web address]

## 2015-05-19 ENCOUNTER — Other Ambulatory Visit: Payer: Self-pay | Admitting: Surgery

## 2015-05-19 DIAGNOSIS — R112 Nausea with vomiting, unspecified: Secondary | ICD-10-CM

## 2015-05-20 ENCOUNTER — Ambulatory Visit
Admission: RE | Admit: 2015-05-20 | Discharge: 2015-05-20 | Disposition: A | Discharge status: Home / Self Care | Payer: BLUE CROSS/BLUE SHIELD | Source: Ambulatory Visit | Attending: Surgery | Admitting: Surgery

## 2015-05-20 ENCOUNTER — Other Ambulatory Visit: Payer: Self-pay | Admitting: Surgery

## 2015-06-22 ENCOUNTER — Other Ambulatory Visit: Payer: Self-pay | Admitting: Surgery

## 2015-07-05 NOTE — H&P (Signed)
  Erin Willis 06/17/2015 4:58 PM Location: [facility] Patient #: [id number] DOB: [birth year] Divorced / Language: [place] / Race: White Female  History of Present Illness  Patient words: recheck.   The patient is a 34 year old female who presents for a follow-up for  Post-Operative. She returns now 8 weeks status post laparoscopic resection of gastrojejunostomy for marginal ulcer with remote history of Roux-en-Y gastric bypass. Initial upper GI series showed a widely patent anastomosis and she was doing well but since about 2-3 weeks postoperatively has had persistent nausea and vomiting solid foods and occasionally liquids. She was seen in my absence and upper GI series has shown narrowing of the lumen at the anastomosis with some evidence of mucosal edema. She has been on continued PPIs. She returns to the office today with essentially no change in her symptoms.  She is mostly able to get protein shakes down but occasionally has vomiting with these. Has frequent nausea. No abdominal pain as she had before her surgery. She is maintaining her weight. We checked lab work at last visit which was unremarkable. At this point I think we need to proceed with upper endoscopy to evaluate her anastomosis and consider dilatation. I discussed this with the patient and she is in agreement.   Discussed with Dr. Ezzard Standing and we will get this scheduled for him. Currently due to her persistent nausea and some weakness related to this she really is unable to perform any activity outside of taking care of her personal needs. Her ability to become more functional and return to work is uncertain at this point and depends on response to her endoscopy and dilatation.   Allergies Doristine Devoid; 06/17/2015 4:59 PM) Phenergan *ANTIHISTAMINES* patient reports Phenergan reaction: visual & auditory hallucinations Vancomycin HCl *ANTI-INFECTIVE AGENTS - MISC.*  Medication History Doristine Devoid;  06/17/2015 4:59 PM) Ondansetron (  Tablet Disperse, 1 (one) Tablet Disperse Oral every four hours, as needed, Taken starting 05/03/2015) Active. Dexilant (  Capsule DR, 1 (one) Capsule DR Oral daily, Taken starting 06/03/2015) Active. Amphetamine-Dextroamphetamine (  Tablet, Oral) Active. Zantac (  Tablet, Oral) Active. Ativan (  Tablet, Oral) Active. Escitalopram Oxalate (  Tablet, Oral) Active. Imitrex (  Tablet, Oral as needed) Active. Medications Reconciled  Vitals Doristine Devoid; 06/17/2015 4:59 PM) 06/17/2015 4:59 PM Weight: 160.2 lb Height: 70in Body Surface Area: 1.89 m Body Mass Index: 22.99 kg/m Temp.: 98.106F(Oral)  Pulse: 70 (Regular)  BP: 114/66 (Sitting, Left Arm, Standard)   Assessment & Plan  1   POST-OPERATIVE NAUSEA AND VOMITING (787.01  R11.2)  Impression: Narrowing of her anastomosis early postop status post revision of gastrojejunostomy. Initially this was wide open and this would appear likely secondary to inflammation or edema. Check lab work today to rule out dehydration  I would like to try to wait this out a little bit longer before considering endoscopy or dilatation. All discussed with her. She has been on over-the-counter Zantac but will change this back to her prescription Dexilant she was on preoperatively.  Current Plans   Follow up in 1 month or as needed   Schedule for Surgery   Upper endoscopy and possible dilatation of gastrojejunostomy  2.  Laparoscopic resection of gastrojejunostomy with creation of a new gastrojejunostomy - 04/22/2015 - [place]    Ovidio Kin, MD, [facility] Surgery Pager: [phone] Office phone:  [phone]

## 2015-07-06 ENCOUNTER — Encounter (HOSPITAL_COMMUNITY): Payer: Self-pay | Admitting: *Deleted

## 2015-07-06 ENCOUNTER — Encounter (HOSPITAL_COMMUNITY)
Admission: RE | Disposition: A | Discharge status: Home / Self Care | Payer: Self-pay | Source: Ambulatory Visit | Attending: Surgery

## 2015-07-06 ENCOUNTER — Ambulatory Visit (HOSPITAL_COMMUNITY)
Admission: RE | Admit: 2015-07-06 | Discharge: 2015-07-06 | Disposition: A | Discharge status: Home / Self Care | Payer: BLUE CROSS/BLUE SHIELD | Source: Ambulatory Visit | Attending: Surgery | Admitting: Surgery

## 2015-07-06 DIAGNOSIS — Z9884 Bariatric surgery status: Secondary | ICD-10-CM | POA: Insufficient documentation

## 2015-07-06 DIAGNOSIS — Z881 Allergy status to other antibiotic agents status: Secondary | ICD-10-CM | POA: Insufficient documentation

## 2015-07-06 DIAGNOSIS — R112 Nausea with vomiting, unspecified: Secondary | ICD-10-CM | POA: Insufficient documentation

## 2015-07-06 DIAGNOSIS — Z79899 Other long term (current) drug therapy: Secondary | ICD-10-CM | POA: Insufficient documentation

## 2015-07-06 DIAGNOSIS — Z888 Allergy status to other drugs, medicaments and biological substances status: Secondary | ICD-10-CM | POA: Insufficient documentation

## 2015-07-06 HISTORY — PX: ESOPHAGOGASTRODUODENOSCOPY: SHX5428

## 2015-07-06 SURGERY — EGD (ESOPHAGOGASTRODUODENOSCOPY)
Anesthesia: Moderate Sedation

## 2015-07-06 MED ORDER — SODIUM CHLORIDE 0.9 % IV SOLN: INTRAVENOUS | Status: DC

## 2015-07-06 MED ORDER — FENTANYL CITRATE (PF) 100 MCG/2ML IJ SOLN
INTRAMUSCULAR | Status: AC
  Filled 2015-07-06: qty 2

## 2015-07-06 MED ORDER — FENTANYL CITRATE (PF) 100 MCG/2ML IJ SOLN
INTRAMUSCULAR | Status: DC | PRN
  Administered 2015-07-06: 25 ug via INTRAVENOUS

## 2015-07-06 MED ORDER — DIPHENHYDRAMINE HCL 50 MG/ML IJ SOLN
INTRAMUSCULAR | Status: AC
  Filled 2015-07-06: qty 1

## 2015-07-06 MED ORDER — MIDAZOLAM HCL 5 MG/ML IJ SOLN
INTRAMUSCULAR | Status: AC
  Filled 2015-07-06: qty 2

## 2015-07-06 MED ORDER — BUTAMBEN-TETRACAINE-BENZOCAINE 2-2-14 % EX AERO
INHALATION_SPRAY | CUTANEOUS | Status: DC | PRN
  Administered 2015-07-06 (×2): 2 via TOPICAL

## 2015-07-06 NOTE — Interval H&P Note (Signed)
History and Physical Interval Note:  07/06/2015 9:07 AM  Erin Willis  has presented today for surgery, with the diagnosis of   Gastrojejunostomy.   The various methods of treatment have been discussed with the patient and family.   Epic was down prior to the procedure.  She is here by herself, but going to have a friend pick her up.  After consideration of risks, benefits and other options for treatment, the patient has consented to  Procedure(s) with comments: UPPER ENDOSCOPY POSSIBLE DILITATION OF STRICTURE (N/A) BALLOON DILATION (N/A) - possible dilation as a surgical intervention .  The patient's history has been reviewed, patient examined, no change in status, stable for surgery.  I have reviewed the patient's chart and labs.  Questions were answered to the patient's satisfaction.     Erin Willis H

## 2015-07-06 NOTE — Progress Notes (Signed)
Due to epic being down, real time charting was not able to be done during the procedure.  of versed, 25 mg of benadryl and 75 mcg of fentanyl was given by myself, Memory Dance. Epic would not allow it to be inputted due to the phase of care change.

## 2015-07-06 NOTE — Op Note (Signed)
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  PROCEDURE:  Esophagogastrojejunoscopy  SURGEON:   Ovidio Kin, M.D.  ANESTHESIA:   Fentanyl  75 mcg   Versed 8 mg  Benedryl 25 mg  INDICATIONS FOR PROCEDURE:  TERRAH DECOSTER is a 34 y.o. (DOB: [birth year])  white  female whose primary care physician is FULK, ALEXIS N, MD and comes for upper endoscopy to evaluate possible gastrojejunal stenosis.  The patient had a revision of a RYGB on 04/22/2015 by Dr. Johna Sheriff.   She has had an upper endoscopy by Dr. Dulce Sellar in the past.  She is limited to liquids and some soft foods.  She has nausea.   The indications and risks of the endoscopy were explained to the patient.  The risks include, but are not limited to, perforation, bleeding, or injury to the bowel.  If balloon dilatation is needed, the risk of perforation is higher.  PROCEDURE:  The patient was in room 3 at [facility] endoscopy unit.  The patient was monitored with a pulse oximetry, BP cuff, and EKG.  The patient has nasal O2 flowing during the procedure.   The back of the throat was anesthestized with Ceticaine x 3.  The patient was positioned in the left lateral decubitus position.  The patient was given Fentanyl and Versed.  A flexibleadult Pentax endoscope (9 mm diameter) was passed down the throat without difficulty.   Findings include:   Esophagus:   Normal   GE junction at:  40 cm   Stomach pouch: Normal   Gastrojejunal anastomosis:   45 cm.  Looking at the anastomosis, it is small, but I was easily able to pass the scope through the anastomosis without resistance.  I would guess the opening is at least 12 mm.  There is no peristomal ulcer or bleeding.   Efferent jejunal limb:  Normal to 20 cm Afferent jejunal limb:   Normal to 10 cm   CLO test:  Not done  PLAN:   Photos taken and given to patient.    She will follow up with Dr. Johna Sheriff in a couple of weeks. If her symptoms persist beyond 6 months, she may need repeat endo with dilatation.  I would do this under propofol - she did not tolerate my MAC well.  Ovidio Kin, MD, [address] [facility] Surgery Pager: [phone] Office phone:  [phone]

## 2015-07-07 ENCOUNTER — Encounter (HOSPITAL_COMMUNITY): Payer: Self-pay | Admitting: Surgery

## 2016-04-17 DIAGNOSIS — D500 Iron deficiency anemia secondary to blood loss (chronic): Secondary | ICD-10-CM | POA: Insufficient documentation

## 2016-08-07 DIAGNOSIS — M545 Low back pain, unspecified: Secondary | ICD-10-CM | POA: Insufficient documentation

## 2016-08-07 DIAGNOSIS — Z8659 Personal history of other mental and behavioral disorders: Secondary | ICD-10-CM | POA: Insufficient documentation

## 2016-08-07 DIAGNOSIS — G8929 Other chronic pain: Secondary | ICD-10-CM | POA: Insufficient documentation

## 2016-11-14 ENCOUNTER — Encounter (HOSPITAL_COMMUNITY): Payer: Self-pay

## 2017-10-09 DIAGNOSIS — F418 Other specified anxiety disorders: Secondary | ICD-10-CM | POA: Insufficient documentation

## 2017-11-06 ENCOUNTER — Encounter (HOSPITAL_COMMUNITY): Payer: Self-pay

## 2017-12-13 DIAGNOSIS — F900 Attention-deficit hyperactivity disorder, predominantly inattentive type: Secondary | ICD-10-CM | POA: Insufficient documentation

## 2018-02-07 DIAGNOSIS — G4719 Other hypersomnia: Secondary | ICD-10-CM | POA: Insufficient documentation

## 2018-11-19 HISTORY — PX: AUGMENTATION MAMMAPLASTY: SUR837

## 2019-04-20 DIAGNOSIS — G40909 Epilepsy, unspecified, not intractable, without status epilepticus: Secondary | ICD-10-CM | POA: Insufficient documentation

## 2019-05-26 DIAGNOSIS — E669 Obesity, unspecified: Secondary | ICD-10-CM | POA: Insufficient documentation

## 2020-01-21 NOTE — Progress Notes (Signed)
New Patient Note  RE: Erin Willis MRN: [id number] DOB: [birth year] Date of Office Visit: 01/22/2020  Referring provider: Vivien Presto, MD Primary care provider: Vivien Presto, MD  Chief Complaint: Allergic Reaction (shrimp- red, itchy skin all over body.  1st episode mid January 2021, 2nd episode 1 week later, 3rd episode 01/01/2020.)  History of Present Illness: I had the pleasure of seeing Erin Willis for initial evaluation at the [facility] on 01/22/2020. She is a 39 y.o. female, who is referred here by Corrington, Kip A, MD for the evaluation of food allergy.  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 The first reaction occurred in January 2021 after she ate about 6 breaded shrimp.  Symptoms started within 1 hour and was in the form of whole body pruritus, nausea and vomiting. Denies any hives, swelling, wheezing, abdominal pain, diarrhea. She does get some GI issues due to her gastric bypass. Denies any associated cofactors such as exertion, infection, NSAID use, or alcohol consumption. The symptoms lasted for a few hours after taking benadryl, zyrtec and using topical creams. She was not evaluated in ED. No one else in the family had any reaction to the same shrimp.   She tried shrimp again 1 week later. She had 3-4 pieces of shrimp and within 5 minutes she had whole body pruritus and nausea. Felt like she couldn't take a deep breath. Symptoms lasted for a few hours after taking benadryl, zyrtec.  In February, she went to a [employer] place and did not have shrimp but had her food cooked on the same place where the shrimp was cooked. Within few minutes after taking a bite of her steak she had some itching of her face and neck. Took benadryl and zyrtec and symptoms resolved. This reaction was less severe than the previous ones.   Prior to this she was consuming shrimp with no issues but now avoiding shellfish and seafood.   She does not have access to  epinephrine autoinjector.  Past work up includes: None. Dietary History: patient has been eating other foods including milk, eggs, peanut, treenuts, sesame, soy, wheat, meats, fruits and vegetables.   Assessment and Plan: Temeka is a 39 y.o. female with: Adverse food reaction Reaction to shrimp on 3 separate occasions in the form of whole body pruritus, rash, nausea. Symptoms resolved within a few hours after benadryl and zyrtec. Previously tolerated shrimp with no issues.   Today's skin testing showed: Negative to shellfish, seafood and mollusks.   The patients history suggests shrimp allergy, though todays skin tests were negative despite a positive histamine control.  Food allergen skin testing has excellent negative predictive value however there is still a 5% chance that the allergy exists. Therefore, we will investigate further with serum specific IgE levels and, if negative then schedule for open graded oral food challenge.  A laboratory order form has been provided for serum specific IgE against shellfish and seafood panel.  Until the food allergy has been definitively ruled out, the patient is to continue meticulous avoidance of shellfish and seafood and have access to epinephrine autoinjector 2 pack.  I have prescribed epinephrine injectable and demonstrated proper use. For mild symptoms you can take over the counter antihistamines such as Benadryl and monitor symptoms closely. If symptoms worsen or if you have severe symptoms including breathing issues, throat closure, significant swelling, whole body hives, severe diarrhea and vomiting, lightheadedness then inject epinephrine and seek immediate medical care afterwards.  Food action plan given.   Return in about 1 year (around 01/21/2021).  Meds ordered this encounter  Medications  . EPINEPHrine 0.3 mg/0.3 mL IJ SOAJ injection    Sig: Use as directed for severe allergic reaction.    Dispense:  2 each    Refill:  1    Lab  Orders     Allergen Profile, Shellfish     Allergen Profile, Food-Fish     Tryptase  Other allergy screening: Asthma: no Rhino conjunctivitis: yes  Minimal rhino conjunctivitis symptoms and takes zyrtec prn with good benefit.  Medication allergy: yes Hymenoptera allergy: no Urticaria: no Eczema:no History of recurrent infections suggestive of immunodeficency: no  Diagnostics: Skin Testing: Select foods. Negative test to: seafood/shellfish/mollusks.  Results discussed with patient/family. Food Adult Perc - 01/22/20 1400    Time Antigen Placed  1424    Allergen Manufacturer  Waynette Buttery    Location  Back    Number of allergen test  16     Control-buffer 50% Glycerol  Negative    Control-Histamine 1 mg/ml  2+    8. Shellfish Mix  Negative    9. Fish Mix  Negative    18. Catfish  Negative    19. Bass  Negative    20. Trout  Negative    21. Tuna  Negative    22. Salmon  Negative    23. Flounder  Negative    24. Codfish  Negative    25. Shrimp  Negative    26. Crab  Negative    27. Lobster  Negative    28. Oyster  Negative    29. Scallops  Negative       Past Medical History: Patient Active Problem List   Diagnosis Date Noted  . Adverse food reaction 01/22/2020  . Class 1 obesity without serious comorbidity with body mass index (BMI) of 30.0 to 30.9 in adult 05/26/2019  . Seizure disorder (HCC) 04/20/2019  . Excessive daytime sleepiness 02/07/2018  . ADHD (attention deficit hyperactivity disorder), inattentive type 12/13/2017  . Depression with anxiety 10/09/2017  . Chronic bilateral low back pain without sciatica 08/07/2016  . History of ADHD 08/07/2016  . Iron deficiency anemia due to chronic blood loss 04/17/2016  . Anastomotic ulcer S/P gastric bypass 04/22/2015  . Anxiety 01/20/1997  . Insomnia 01/20/1997  . Migraines 01/20/1997   Past Medical History:  Diagnosis Date  . Attention deficit disorder   . Bronchitis    hx of   . Complication of anesthesia    has  awaken during wisdom teeth extraction   . Gastric ulcer   . GERD (gastroesophageal reflux disease)   . Headache    migraines has prescription med to use for relief  . Iron deficiency anemia   . Kidney stone    hx of per left   . Pneumonia    hx of infancy   . PONV (postoperative nausea and vomiting)   . Urinary tract infection    hx of   . Urticaria    Past Surgical History: Past Surgical History:  Procedure Laterality Date  . BREAST SURGERY     lift / implant 2011  . CHOLECYSTECTOMY    . ESOPHAGOGASTRODUODENOSCOPY N/A 07/06/2015   Procedure: UPPER ENDOSCOPY POSSIBLE DILITATION OF STRICTURE;  Surgeon: Ovidio Kin, MD;  Location: [facility] END[facility]COPY;  Service: Endoscopy;  Laterality: N/A;  . GASTRIC BYPASS     2011   . INTUSSUSCEPTION REPAIR     08/2014   .  LAPAROSCOPIC REVISION OF GASTROJEJUNOSTOMY N/A 04/22/2015   Procedure: LAPAROSCOPIC RESECTION OF GASTROJEJUNOSTOMY;  Surgeon: Excell Seltzer, MD;  Location: [facility] ORS;  Service: General;  Laterality: N/A;  . TONSILLECTOMY     T&A age 58  . tummy tuck     . TYMPANOSTOMY TUBE PLACEMENT     bilat   . UPPER GI ENDOSCOPY    . UPPER GI ENDOSCOPY  04/22/2015   Procedure: UPPER GI ENDOSCOPY;  Surgeon: Excell Seltzer, MD;  Location: [facility] ORS;  Service: General;;  . uterine ablation     . WISDOM TOOTH EXTRACTION     Medication List:  Current Outpatient Medications  Medication Sig Dispense Refill  . amphetamine-dextroamphetamine (ADDERALL) 30 MG tablet Take 30 mg by mouth 3 (three) times daily.    . cyanocobalamin (,VITAMIN B-12,) 1000 MCG/ML injection Inject into the muscle.    Eduard Roux (AIMOVIG) 140 MG/ML SOAJ Inject into the skin.    . folic acid (FOLVITE) 1 MG tablet Take by mouth.    . gabapentin (NEURONTIN) 600 MG tablet Take by mouth.    . lamoTRIgine (LAMICTAL) 150 MG tablet Take by mouth.    Marland Kitchen LORazepam (ATIVAN) 1 MG tablet Take 1 mg by mouth at bedtime as needed for sleep.    . Multiple Vitamin (MULTI-VITAMIN) tablet  Take by mouth.    . rizatriptan (MAXALT) 10 MG tablet TAKE 1 TABLET AT ONSET OF HEADACHE. MAY REPEAT IN 2 HOURS IF NEEDED BUT NO MORE THAN 2 IN 24 HOURS    . tiZANidine (ZANAFLEX) 4 MG tablet TAKE 1 3 BY MOUTH EVERY EVENING AS NEEDED    . zolpidem (AMBIEN) 10 MG tablet Take 10 mg by mouth at bedtime as needed for sleep.    Marland Kitchen EPINEPHrine 0.3 mg/0.3 mL IJ SOAJ injection Use as directed for severe allergic reaction. 2 each 1  . fluticasone (FLONASE) 50 MCG/ACT nasal spray Place 1 spray into both nostrils daily as needed for allergies or rhinitis.     No current facility-administered medications for this visit.   Allergies: Allergies  Allergen Reactions  . Vancomycin Hives and Itching    "crawling out of skin"  . Phenergan [Promethazine Hcl] Other (See Comments)    Visual and Auditory Hallucinations.    Social History: Social History   Socioeconomic History  . Marital status: Divorced    Spouse name: Not on file  . Number of children: Not on file  . Years of education: Not on file  . Highest education level: Not on file  Occupational History  . Not on file  Tobacco Use  . Smoking status: Never Smoker  . Smokeless tobacco: Never Used  Substance and Sexual Activity  . Alcohol use: Yes    Comment: occas wine beer or liquor monthly   . Drug use: No  . Sexual activity: Not on file  Other Topics Concern  . Not on file  Social History Narrative  . Not on file   Social Determinants of Health   Financial Resource Strain:   . Difficulty of Paying Living Expenses: Not on file  Food Insecurity:   . Worried About Charity fundraiser in the Last Year: Not on file  . Ran Out of Food in the Last Year: Not on file  Transportation Needs:   . Lack of Transportation (Medical): Not on file  . Lack of Transportation (Non-Medical): Not on file  Physical Activity:   . Days of Exercise per Week: Not on file  . Minutes of  Exercise per Session: Not on file  Stress:   . Feeling of Stress : Not  on file  Social Connections:   . Frequency of Communication with Friends and Family: Not on file  . Frequency of Social Gatherings with Friends and Family: Not on file  . Attends Religious Services: Not on file  . Active Member of Clubs or Organizations: Not on file  . Attends Banker Meetings: Not on file  . Marital Status: Not on file   Lives in a house built in the 1970s. Smoking: denies Occupation: n/a  Environmental HistorySurveyor, minerals in the house: no Carpet in the family room: no Carpet in the bedroom: no Heating: gas, electric, heat pump Cooling: central, heat pump Pet: yes 1 dog x 4 yrs  Family History: Family History  Problem Relation Age of Onset  . Hypertension Mother   . Allergic rhinitis Son   . Food Allergy Son        tree nut allergy  . Asthma Son   . Angioedema Neg Hx   . Eczema Neg Hx   . Immunodeficiency Neg Hx   . Urticaria Neg Hx    Review of Systems  Constitutional: Negative for appetite change, chills, fever and unexpected weight change.  HENT: Negative for congestion and rhinorrhea.   Eyes: Negative for itching.  Respiratory: Negative for cough, chest tightness, shortness of breath and wheezing.   Cardiovascular: Negative for chest pain.  Gastrointestinal: Negative for abdominal pain.  Genitourinary: Negative for difficulty urinating.  Skin: Positive for rash.  Neurological: Negative for headaches.   Objective: BP 136/84 (BP Location: Right Arm, Patient Position: Sitting, Cuff Size: Normal)   Pulse 74   Temp 98.4 F (36.9 C) (Oral)   Resp 16   Ht 5' 8.3" (1.735 m)   Wt 201 lb 3.2 oz (91.3 kg)   SpO2 100%   BMI 30.32 kg/m  Body mass index is 30.32 kg/m. Physical Exam  Constitutional: She is oriented to person, place, and time. She appears well-developed and well-nourished.  HENT:  Head: Normocephalic and atraumatic.  Right Ear: External ear normal.  Left Ear: External ear normal.  Nose: Nose normal.    Mouth/Throat: Oropharynx is clear and moist.  Eyes: Conjunctivae and EOM are normal.  Cardiovascular: Normal rate, regular rhythm and normal heart sounds. Exam reveals no gallop and no friction rub.  No murmur heard. Pulmonary/Chest: Effort normal and breath sounds normal. She has no wheezes. She has no rales.  Abdominal: Soft.  Musculoskeletal:     Cervical back: Neck supple.  Neurological: She is alert and oriented to person, place, and time.  Skin: Skin is warm. No rash noted.  Psychiatric: She has a normal mood and affect. Her behavior is normal.  Nursing note and vitals reviewed.  The plan was reviewed with the patient/family, and all questions/concerned were addressed.  It was my pleasure to see Brandii today and participate in her care. Please feel free to contact me with any questions or concerns.  Sincerely,  Wyline Mood, DO Allergy & Immunology  [facility] office: [phone] [facility] office: [phone] [place] office: [phone]

## 2020-01-22 ENCOUNTER — Encounter: Payer: Self-pay | Admitting: Allergy

## 2020-01-22 ENCOUNTER — Ambulatory Visit (INDEPENDENT_AMBULATORY_CARE_PROVIDER_SITE_OTHER): Payer: Medicaid Other | Admitting: Allergy

## 2020-01-22 ENCOUNTER — Other Ambulatory Visit: Payer: Self-pay

## 2020-01-22 VITALS — BP 136/84 | HR 74 | Temp 98.4°F | Resp 16 | Ht 68.3 in | Wt 201.2 lb

## 2020-01-22 DIAGNOSIS — T781XXA Other adverse food reactions, not elsewhere classified, initial encounter: Secondary | ICD-10-CM | POA: Insufficient documentation

## 2020-01-22 DIAGNOSIS — T781XXD Other adverse food reactions, not elsewhere classified, subsequent encounter: Secondary | ICD-10-CM

## 2020-01-22 MED ORDER — EPINEPHRINE 0.3 MG/0.3ML IJ SOAJ: INTRAMUSCULAR | 1 refills | Status: DC

## 2020-01-22 NOTE — Patient Instructions (Addendum)
Today's skin testing showed:  Negative to shellfish and seafood.  The patients history suggests shrimp allergy, though todays skin tests were negative despite a positive histamine control.  Food allergen skin testing has excellent negative predictive value however there is still a 5% chance that the allergy exists. Therefore, we will investigate further with serum specific IgE levels and, if negative then schedule for open graded oral food challenge.  A laboratory order form has been provided for serum specific IgE against shellfish and seafood panel.  Until the food allergy has been definitively ruled out, the patient is to continue meticulous avoidance of shellfish and seafood and have access to epinephrine autoinjector 2 pack.  I have prescribed epinephrine injectable and demonstrated proper use. For mild symptoms you can take over the counter antihistamines such as Benadryl and monitor symptoms closely. If symptoms worsen or if you have severe symptoms including breathing issues, throat closure, significant swelling, whole body hives, severe diarrhea and vomiting, lightheadedness then inject epinephrine and seek immediate medical care afterwards.  Food action plan given.   Follow up in 1 year or sooner if needed.

## 2020-01-22 NOTE — Assessment & Plan Note (Signed)
Reaction to shrimp on 3 separate occasions in the form of whole body pruritus, rash, nausea. Symptoms resolved within a few hours after benadryl and zyrtec. Previously tolerated shrimp with no issues.   Today's skin testing showed: Negative to shellfish, seafood and mollusks.   The patients history suggests shrimp allergy, though todays skin tests were negative despite a positive histamine control.  Food allergen skin testing has excellent negative predictive value however there is still a 5% chance that the allergy exists. Therefore, we will investigate further with serum specific IgE levels and, if negative then schedule for open graded oral food challenge.  A laboratory order form has been provided for serum specific IgE against shellfish and seafood panel.  Until the food allergy has been definitively ruled out, the patient is to continue meticulous avoidance of shellfish and seafood and have access to epinephrine autoinjector 2 pack.  I have prescribed epinephrine injectable and demonstrated proper use. For mild symptoms you can take over the counter antihistamines such as Benadryl and monitor symptoms closely. If symptoms worsen or if you have severe symptoms including breathing issues, throat closure, significant swelling, whole body hives, severe diarrhea and vomiting, lightheadedness then inject epinephrine and seek immediate medical care afterwards.  Food action plan given.

## 2020-01-25 LAB — ALLERGEN PROFILE, SHELLFISH
Clam IgE: <0.1 kU/L
F023-IgE Crab: <0.1 kU/L
F080-IgE Lobster: <0.1 kU/L
F290-IgE Oyster: <0.1 kU/L
Scallop IgE: <0.1 kU/L
Shrimp IgE: <0.1 kU/L

## 2020-01-25 LAB — ALLERGEN PROFILE, FOOD-FISH
Allergen Mackerel IgE: <0.1 kU/L
Allergen Salmon IgE: <0.1 kU/L
Allergen Trout IgE: <0.1 kU/L
Allergen Walley Pike IgE: <0.1 kU/L
Codfish IgE: <0.1 kU/L
Halibut IgE: <0.1 kU/L
Tuna: <0.1 kU/L

## 2020-01-25 LAB — TRYPTASE: Tryptase: 4.1 ug/L (ref 2.2–13.2)

## 2020-04-04 ENCOUNTER — Other Ambulatory Visit: Payer: Self-pay

## 2020-04-04 MED ORDER — EPINEPHRINE 0.3 MG/0.3ML IJ SOAJ: INTRAMUSCULAR | 3 refills | Status: DC

## 2020-04-04 NOTE — Telephone Encounter (Signed)
Refill for Epipen 0.3 mg at [facility]. Called patient to see if she would like Auvi-Q instead. Patient stated, Epipen was OK to send.

## 2020-05-09 ENCOUNTER — Encounter: Payer: Self-pay | Admitting: Allergy

## 2020-05-10 ENCOUNTER — Telehealth: Payer: Self-pay

## 2020-05-10 NOTE — Telephone Encounter (Signed)
Jocee, Kissick Carrington Clamp, DO 13 hours ago (8:33 PM)    I know my allergy testing was negative.  I made the mistake of eating crab legs [place] the beach this [place].  Within minutes of the first few bites I was itching, having difficulty breathing, coughing.   After multiple antihistamines, two rounds of Epi breathing was better.   Any suggestions for additional testing? Or simply avoid all shellfish and keep epi-pens with me?    Please advise all testing was negative and you did suggest a in office challenge to shrimp

## 2020-05-10 NOTE — Telephone Encounter (Signed)
Please call patient.  She pretty much did the shrimp challenge by eating it.  The skin prick testing and the bloodwork both were done for the shellfish panel which in her case were negative and I did recommend NOT to eat it at home and to do the in office food challenge as she had a strong clinical history and even patients who have negative testing there are about 5-10% that still react with the food when ingested.  For now, there is no other test to do.   Continue strict avoidance of shellfish - shrimp, crab, lobster.  Carry epinephrine with her.  Will re-assess in 1 year. Thank you.

## 2020-05-10 NOTE — Telephone Encounter (Signed)
Spoke with pt she will avoid shellfish from now on and will call first part of next year to get on scheduled for retesting and the schedule does not go out to that one year date

## 2021-02-09 ENCOUNTER — Other Ambulatory Visit: Payer: Self-pay | Admitting: Obstetrics and Gynecology

## 2021-02-09 DIAGNOSIS — N63 Unspecified lump in unspecified breast: Secondary | ICD-10-CM

## 2021-03-24 ENCOUNTER — Ambulatory Visit
Admission: RE | Admit: 2021-03-24 | Discharge: 2021-03-24 | Disposition: A | Discharge status: Home / Self Care | Payer: 59 | Source: Ambulatory Visit | Attending: Obstetrics and Gynecology | Admitting: Obstetrics and Gynecology

## 2021-03-24 ENCOUNTER — Ambulatory Visit
Admission: RE | Admit: 2021-03-24 | Discharge: 2021-03-24 | Disposition: A | Discharge status: Home / Self Care | Payer: BLUE CROSS/BLUE SHIELD | Source: Ambulatory Visit | Attending: Obstetrics and Gynecology | Admitting: Obstetrics and Gynecology

## 2021-03-24 ENCOUNTER — Other Ambulatory Visit: Payer: Self-pay | Admitting: Obstetrics and Gynecology

## 2021-03-24 ENCOUNTER — Other Ambulatory Visit: Payer: Self-pay

## 2021-03-24 DIAGNOSIS — N63 Unspecified lump in unspecified breast: Secondary | ICD-10-CM

## 2021-06-02 ENCOUNTER — Other Ambulatory Visit: Payer: Self-pay | Admitting: Allergy

## 2021-06-07 ENCOUNTER — Other Ambulatory Visit: Payer: Self-pay | Admitting: Allergy

## 2021-07-07 ENCOUNTER — Other Ambulatory Visit: Payer: Self-pay

## 2021-07-07 ENCOUNTER — Ambulatory Visit (INDEPENDENT_AMBULATORY_CARE_PROVIDER_SITE_OTHER): Payer: 59 | Admitting: Allergy

## 2021-07-07 ENCOUNTER — Encounter: Payer: Self-pay | Admitting: Allergy

## 2021-07-07 VITALS — BP 106/70 | Temp 98.2°F | Resp 20 | Ht 69.0 in | Wt 183.0 lb

## 2021-07-07 DIAGNOSIS — R059 Cough, unspecified: Secondary | ICD-10-CM | POA: Diagnosis not present

## 2021-07-07 DIAGNOSIS — T7802XD Anaphylactic reaction due to shellfish (crustaceans), subsequent encounter: Secondary | ICD-10-CM

## 2021-07-07 MED ORDER — ALBUTEROL SULFATE HFA 108 (90 BASE) MCG/ACT IN AERS: 2.0000 | INHALATION_SPRAY | RESPIRATORY_TRACT | 1 refills | Status: DC | PRN

## 2021-07-07 MED ORDER — EPINEPHRINE 0.3 MG/0.3ML IJ SOAJ: 0.3000 mg | INTRAMUSCULAR | 1 refills | Status: AC | PRN

## 2021-07-07 MED ORDER — EPINEPHRINE 0.3 MG/0.3ML IJ SOAJ: 0.3000 mg | INTRAMUSCULAR | 1 refills | Status: DC | PRN

## 2021-07-07 NOTE — Assessment & Plan Note (Signed)
Past history - Reaction to shrimp on 3 separate occasions in the form of whole body pruritus, rash, nausea. Symptoms resolved within a few hours after benadryl and zyrtec. Previously tolerated shrimp with no issues. 2021 skin testing showed: Negative to shellfish, seafood and mollusks. 2021 bloodwork negative to shellfish, fish and mollusks. Interim history - anaphylactic reaction to crab 1 year ago and self-administered epi x 4 due to persistent coughing. Less severe reaction to shrimp being cooked in front of her and self-administered epi x 2. Patient did not go to ER afterwards. No history of asthma. Patient did not schedule for shellfish in office food challenge as recommended. . Continue strict avoidance of shellfish and mollusks - see handout. [place] to eat finned fish - be careful about cross contamination. . I have prescribed epinephrine injectable and demonstrated proper use. For mild symptoms you can take over the counter antihistamines such as Benadryl and monitor symptoms closely. If symptoms worsen or if you have severe symptoms including breathing issues, throat closure, significant swelling, whole body hives, severe diarrhea and vomiting, lightheadedness then inject epinephrine and seek immediate medical care afterwards. . Action plan updated.  Today's spirometry was normal - obtained due to persistent coughing during allergic episodes.  May use albuterol rescue inhaler 2 to 4 puffs 4 to 6 hours as needed for shortness of breath, chest tightness, coughing, and wheezing. Monitor frequency of use.  Advised patient that albuterol does not replace epinephrine in case of allergic reaction.

## 2021-07-07 NOTE — Patient Instructions (Addendum)
Food allergies Continue strict avoidance of shellfish and mollusks - see handout. Okay to eat finned fish - be careful about cross contamination. I have prescribed epinephrine injectable and demonstrated proper use. For mild symptoms you can take over the counter antihistamines such as Benadryl and monitor symptoms closely. If symptoms worsen or if you have severe symptoms including breathing issues, throat closure, significant swelling, whole body hives, severe diarrhea and vomiting, lightheadedness then inject epinephrine and seek immediate medical care afterwards. Action plan updated.  Coughing fits: Normal breathing test today. May use albuterol rescue inhaler 2 to 4 puffs 4 to 6 hours as needed for shortness of breath, chest tightness, coughing, and wheezing. Monitor frequency of use.   Follow up in 1 year or sooner if needed.

## 2021-07-07 NOTE — Progress Notes (Signed)
Follow Up Note  RE: Erin Willis MRN: [id number] DOB: 11/02/1981 Date of Office Visit: 07/07/2021  Referring provider: Vivien Presto, MD Primary care provider: Vivien Presto, MD  Chief Complaint: Allergies  History of Present Illness: I had the pleasure of seeing Erin Willis for a follow up visit at the [facility] on 07/07/2021. She is a 40 y.o. female, who is being followed for adverse food reaction. Her previous allergy office visit was on 01/22/2020 with Dr. Selena Batten. Today is a regular follow up visit.  Adverse food reaction Currently avoiding shellfish. No issues with finned fish.  Patient tried crab about 1 year ago and started to have eye itching and coughing within minutes of ingestion.  Patient self-administered 4 Epipens due to persistent symptoms (mainly coughing) but did not go to ER afterwards.   Patient went to [employer] and when they were cooking the shrimp in front of her, she noted coughing and itching. She took Epipen x 2 and did not go to the ER this time either.   It was recommended that she come in for in office shellfish challenge which patient never scheduled.   No prior history of asthma. No prior inhaler use.  Otherwise denies any SOB, coughing, wheezing, chest tightness.   Assessment and Plan: Shanequa is a 40 y.o. 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 Symptoms resolved within a few hours after benadryl and zyrtec. Previously tolerated shrimp with no issues. 2021 skin testing showed: Negative to shellfish, seafood and mollusks. 2021 bloodwork negative to shellfish, fish and mollusks. Interim history - anaphylactic reaction to crab 1 year ago and self-administered epi x 4 due to persistent coughing. Less severe reaction to shrimp being cooked in front of her and self-administered epi x 2. Patient  did not go to ER afterwards. No history of asthma. Patient did not schedule for shellfish in office food challenge as recommended. Continue strict avoidance of shellfish and mollusks - see handout. Okay to eat finned fish - be careful about cross contamination. I have prescribed epinephrine injectable and demonstrated proper use. For mild symptoms you can take over the counter antihistamines such as Benadryl and monitor symptoms closely. If symptoms worsen or if you have severe symptoms including breathing issues, throat closure, significant swelling, whole body hives, severe diarrhea and vomiting, lightheadedness then inject epinephrine and seek immediate medical care afterwards. Action plan updated. Today's spirometry was normal - obtained due to persistent coughing during allergic episodes. May use albuterol rescue inhaler 2 to 4 puffs 4 to 6 hours as needed for shortness of breath, chest tightness, coughing, and wheezing. Monitor frequency of use. Advised patient that albuterol does not replace epinephrine in case of allergic reaction.   Return in about 1 year (around 07/07/2022).  Meds ordered this encounter  Medications   EPINEPHrine (AUVI-Q) 0.3 mg/0.3 mL IJ SOAJ injection    Sig: Inject 0.3 mg into the muscle as needed for anaphylaxis.    Dispense:  2 each    Refill:  1    [phone]   EPINEPHrine 0.3 mg/0.3 mL IJ SOAJ injection    Sig: Inject 0.3 mg into the muscle as needed for anaphylaxis.    Dispense:  2 each    Refill:  1    Please dispense generic brand mylan or teva   albuterol (VENTOLIN HFA) 108 (90 Base)  MCG/ACT inhaler    Sig: Inhale 2-4 puffs into the lungs every 4 (four) hours as needed for wheezing or shortness of breath (coughing fits).    Dispense:  18 g    Refill:  1    Lab Orders  No laboratory test(s) ordered today    Diagnostics: Spirometry:  Tracings reviewed. Her effort: Good reproducible efforts. FVC: 4.56L FEV1: 3.59L, 102% predicted FEV1/FVC  ratio: 79% Interpretation: Spirometry consistent with normal pattern.  Please see scanned spirometry results for details.  Medication List:  Current Outpatient Medications  Medication Sig Dispense Refill   albuterol (VENTOLIN HFA) 108 (90 Base) MCG/ACT inhaler Inhale 2-4 puffs into the lungs every 4 (four) hours as needed for wheezing or shortness of breath (coughing fits). 18 g 1   amphetamine-dextroamphetamine (ADDERALL) 30 MG tablet Take 30 mg by mouth 3 (three) times daily.     cyanocobalamin (,VITAMIN B-12,) 1000 MCG/ML injection Inject into the muscle.     EPINEPHrine (AUVI-Q) 0.3 mg/0.3 mL IJ SOAJ injection Inject 0.3 mg into the muscle as needed for anaphylaxis. 2 each 1   Erenumab-aooe (AIMOVIG) 140 MG/ML SOAJ Inject into the skin.     fluticasone (FLONASE) 50 MCG/ACT nasal spray Place 1 spray into both nostrils daily as needed for allergies or rhinitis.     lipase/protease/amylase (CREON) 36000 UNITS CPEP capsule Take 36,000 Units by mouth. 2 capsules with meals, 1 capsule with snack.     LORazepam (ATIVAN) 1 MG tablet Take 1 mg by mouth at bedtime as needed for sleep.     Multiple Vitamin (MULTI-VITAMIN) tablet Take by mouth.     rizatriptan (MAXALT) 10 MG tablet TAKE 1 TABLET AT ONSET OF HEADACHE. MAY REPEAT IN 2 HOURS IF NEEDED BUT NO MORE THAN 2 IN 24 HOURS     tiZANidine (ZANAFLEX) 4 MG tablet TAKE 1 3 BY MOUTH EVERY EVENING AS NEEDED     zolpidem (AMBIEN) 10 MG tablet Take 10 mg by mouth at bedtime as needed for sleep.     EPINEPHrine 0.3 mg/0.3 mL IJ SOAJ injection Inject 0.3 mg into the muscle as needed for anaphylaxis. 2 each 1   lamoTRIgine (LAMICTAL) 150 MG tablet Take by mouth.     No current facility-administered medications for this visit.   Allergies: Allergies  Allergen Reactions   Shellfish Allergy Itching   Vancomycin Hives and Itching    "crawling out of skin"   Phenergan [Promethazine Hcl] Other (See Comments)    Visual and Auditory Hallucinations with  long term use    I reviewed her past medical history, social history, family history, and environmental history and no significant changes have been reported from her previous visit.  Review of Systems  Constitutional:  Negative for appetite change, chills, fever and unexpected weight change.  HENT:  Negative for congestion and rhinorrhea.   Eyes:  Negative for itching.  Respiratory:  Negative for cough, chest tightness, shortness of breath and wheezing.   Cardiovascular:  Negative for chest pain.  Gastrointestinal:  Negative for abdominal pain.  Genitourinary:  Negative for difficulty urinating.  Skin:  Negative for rash.  Allergic/Immunologic: Positive for food allergies.  Neurological:  Negative for headaches.   Objective: BP 106/70   Temp 98.2 F (36.8 C) (Oral)   Resp 20   Ht 5\' 9"  (1.753 m)   Wt 183 lb (83 kg)   BMI 27.02 kg/m  Body mass index is 27.02 kg/m. Physical Exam Vitals and nursing note reviewed.  Constitutional:  Appearance: She is well-developed.  HENT:     Head: Normocephalic and atraumatic.     Right Ear: External ear normal.     Left Ear: External ear normal.     Nose: Nose normal.  Eyes:     Conjunctiva/sclera: Conjunctivae normal.  Cardiovascular:     Rate and Rhythm: Normal rate and regular rhythm.     Heart sounds: Normal heart sounds. No murmur heard.   No friction rub. No gallop.  Pulmonary:     Effort: Pulmonary effort is normal.     Breath sounds: Normal breath sounds. No wheezing or rales.  Abdominal:     Palpations: Abdomen is soft.  Musculoskeletal:     Cervical back: Neck supple.  Skin:    General: Skin is warm.     Findings: No rash.  Neurological:     Mental Status: She is alert and oriented to person, place, and time.  Psychiatric:        Behavior: Behavior normal.   Previous notes and tests were reviewed. The plan was reviewed with the patient/family, and all questions/concerned were addressed.  It was my pleasure to  see Luria today and participate in her care. Please feel free to contact me with any questions or concerns.  Sincerely,  Wyline Mood, DO Allergy & Immunology  [facility] office: [phone] [facility] office: [phone]

## 2021-07-17 ENCOUNTER — Other Ambulatory Visit: Payer: Self-pay | Admitting: *Deleted

## 2021-07-17 MED ORDER — ALBUTEROL SULFATE HFA 108 (90 BASE) MCG/ACT IN AERS: 2.0000 | INHALATION_SPRAY | Freq: Four times a day (QID) | RESPIRATORY_TRACT | 1 refills | Status: DC | PRN

## 2021-08-08 ENCOUNTER — Other Ambulatory Visit: Payer: Self-pay | Admitting: Allergy

## 2021-10-06 ENCOUNTER — Other Ambulatory Visit: Payer: Self-pay | Admitting: Allergy

## 2021-11-12 ENCOUNTER — Other Ambulatory Visit: Payer: Self-pay | Admitting: Allergy

## 2021-11-13 ENCOUNTER — Other Ambulatory Visit: Payer: Self-pay | Admitting: Allergy

## 2021-12-01 ENCOUNTER — Other Ambulatory Visit: Payer: Self-pay | Admitting: Allergy

## 2022-05-22 ENCOUNTER — Other Ambulatory Visit: Payer: Self-pay | Admitting: Allergy

## 2022-05-24 ENCOUNTER — Other Ambulatory Visit: Payer: Self-pay

## 2022-05-24 MED ORDER — EPINEPHRINE 0.3 MG/0.3ML IJ SOAJ: 0.3000 mg | INTRAMUSCULAR | 0 refills | Status: DC | PRN

## 2022-06-21 ENCOUNTER — Other Ambulatory Visit: Payer: Self-pay | Admitting: Allergy

## 2022-07-11 ENCOUNTER — Other Ambulatory Visit: Payer: Self-pay | Admitting: Allergy

## 2022-08-29 IMAGING — MG MM  DIGITAL DIAGNOSTIC BREAST BILAT IMPLANT W/ TOMO W/ CAD
8 of 14 series · 8 of 34 positions shown · non-contrast
Comparison: None.

CLINICAL DATA: Mass felt by the patient in the upper outer left
breast for the past 4 weeks without tenderness. She does have some
focal tenderness in the left axilla for the past 2 weeks.

EXAM:
DIGITAL DIAGNOSTIC BILATERAL MAMMOGRAM WITH IMPLANTS, CAD AND
TOMOSYNTHESIS; ULTRASOUND LEFT BREAST LIMITED
TECHNIQUE: Bilateral digital diagnostic mammography and breast tomosynthesis
was performed. The images were evaluated with computer-aided
detection. Standard and/or implant displaced views were performed.;
Targeted ultrasound examination of the left breast was performed

[L MLO]
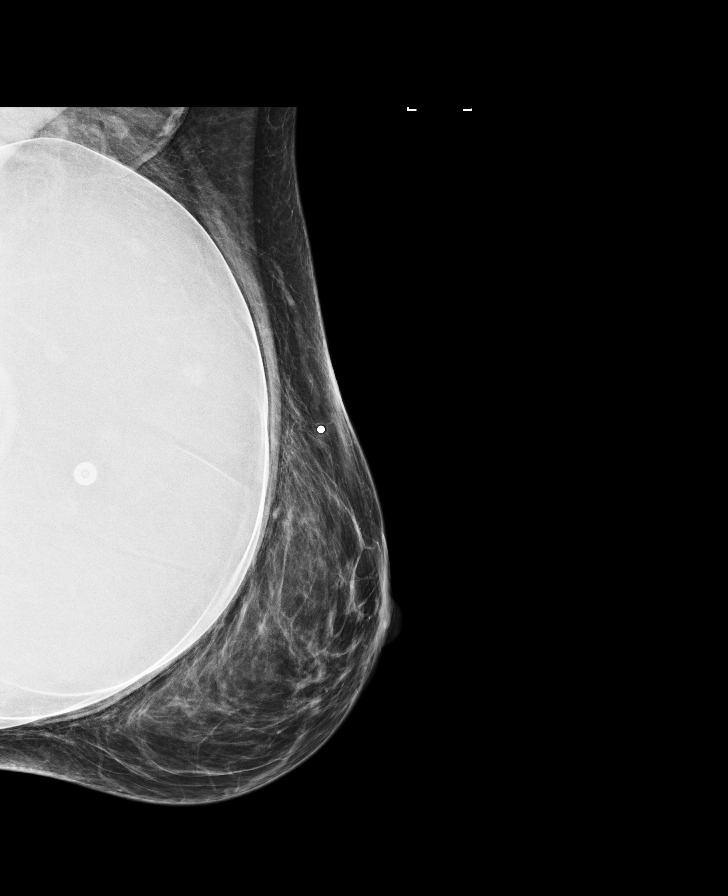

[R MLO]
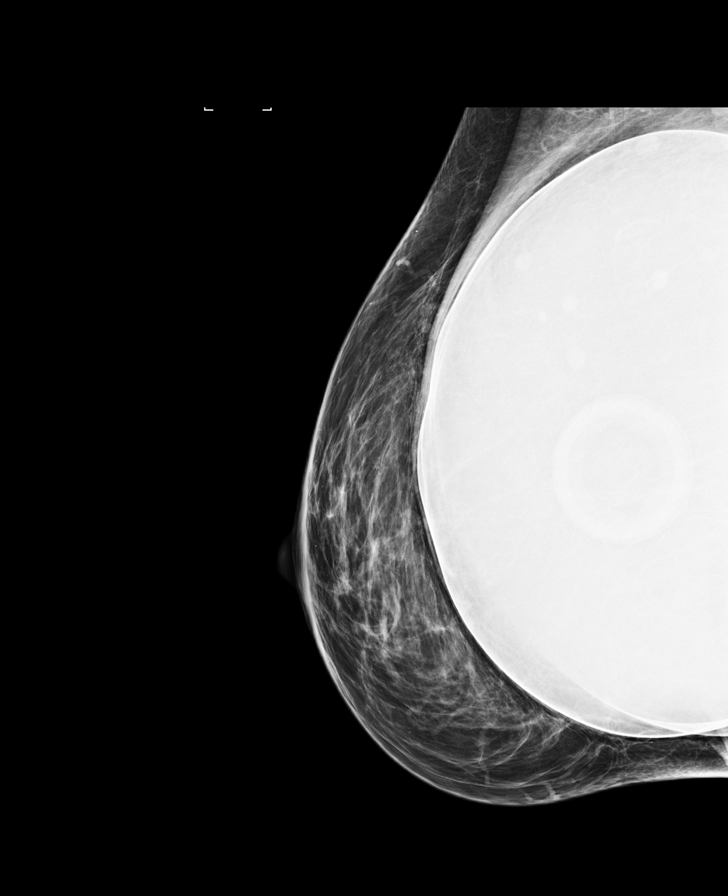

[L CC]
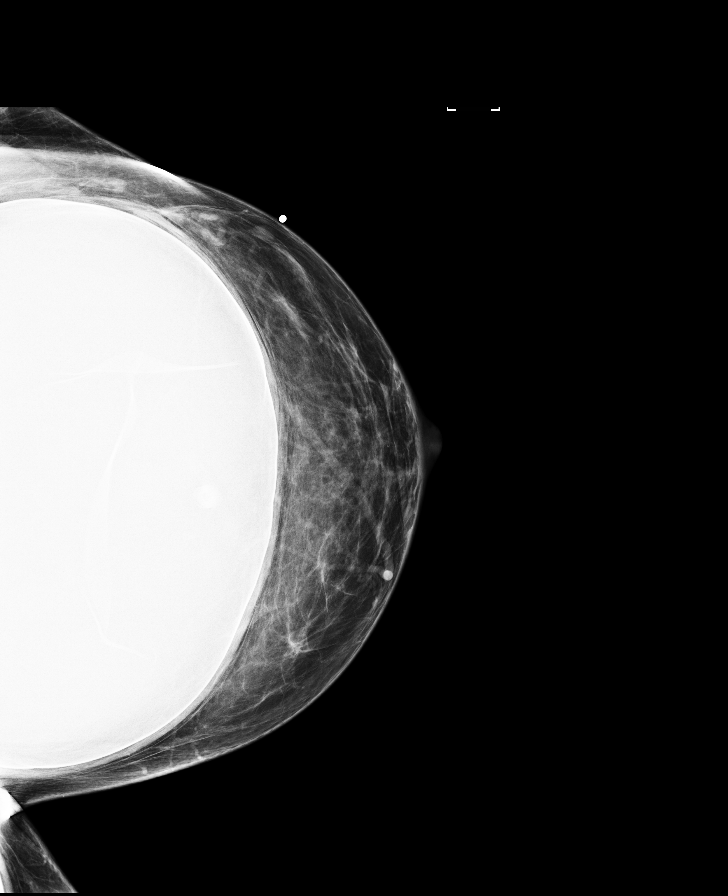

[R CC]
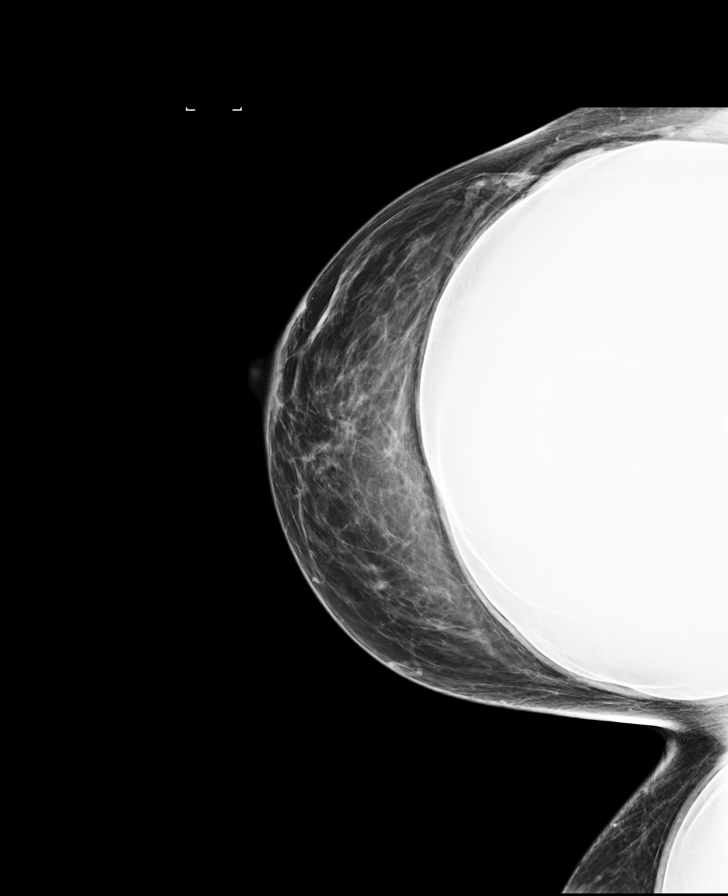

[L CC synth-2D (1 of 2)]
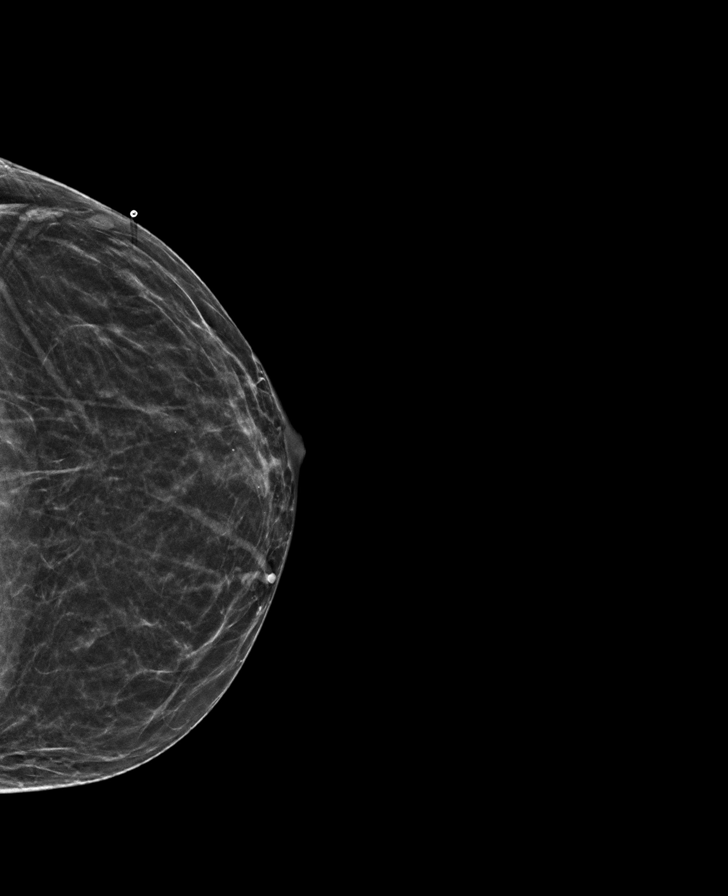

[R CC synth-2D]
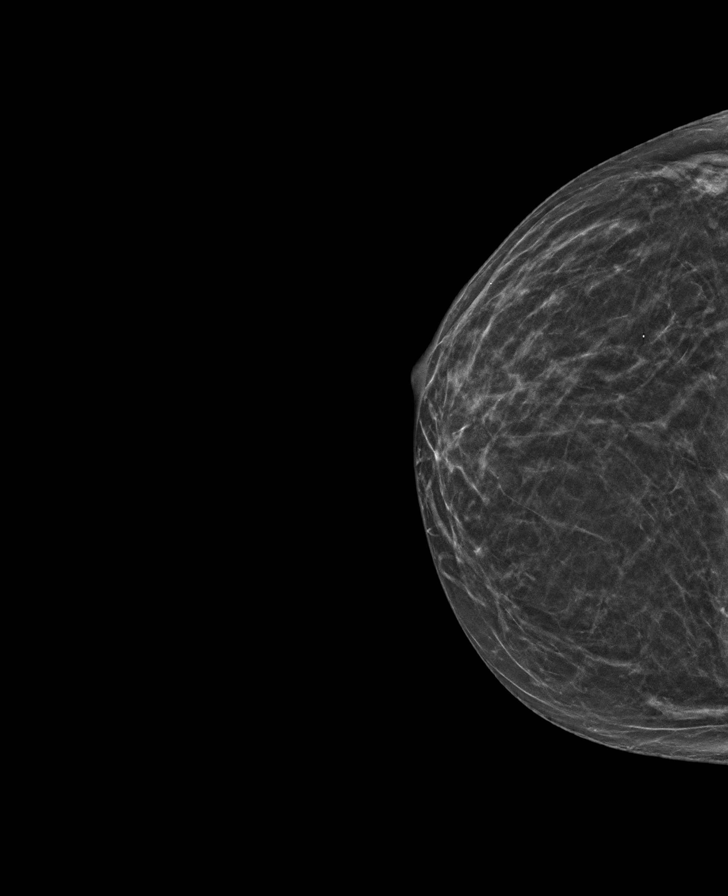

[L MLO synth-2D]
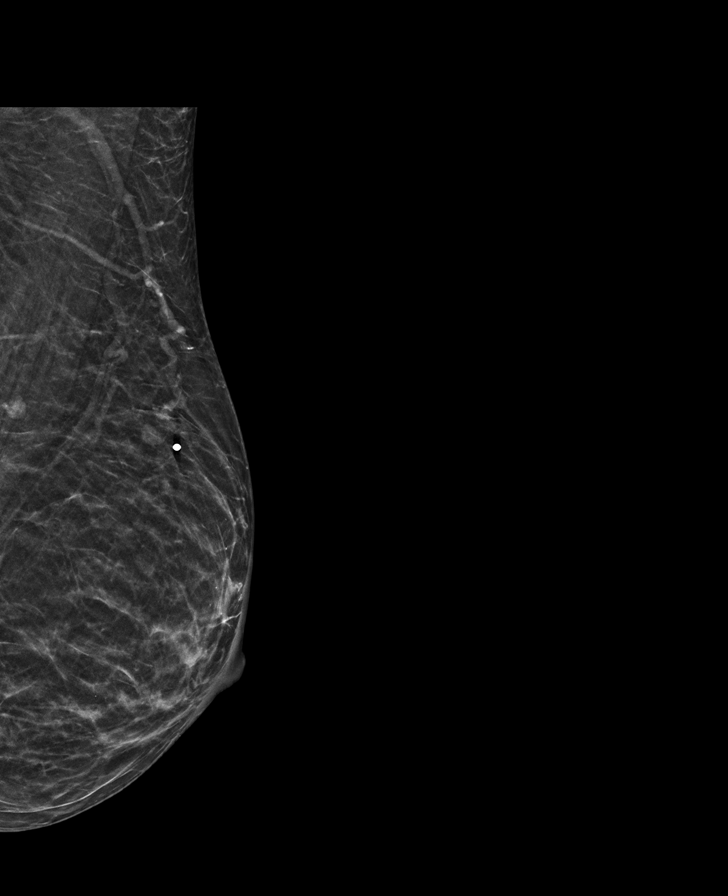

[L CC synth-2D (2 of 2)]
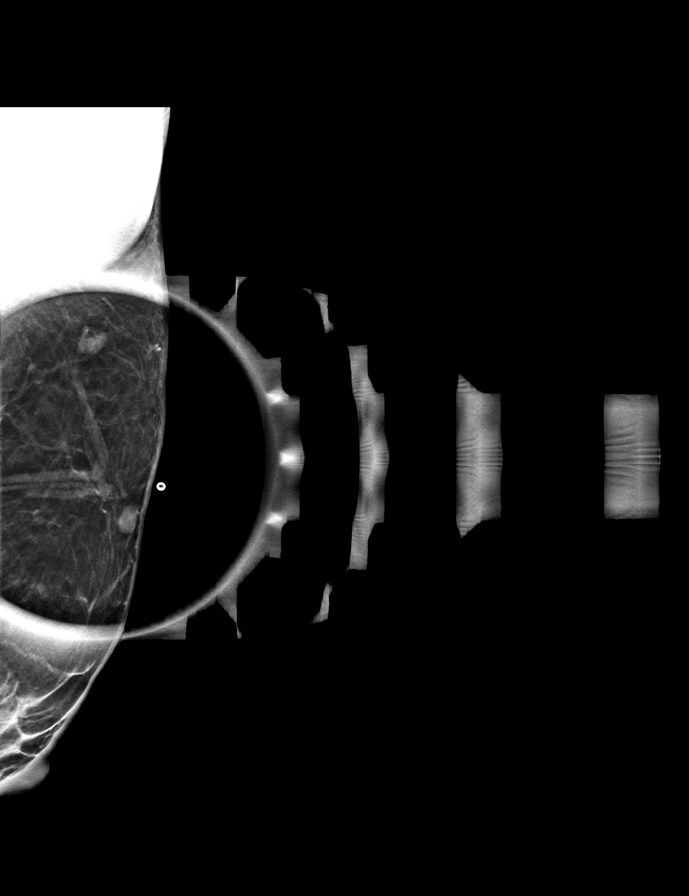

[8 of 34 positions shown; findings below may reference images not displayed]

ACR Breast Density Category b: There are scattered areas of
fibroglandular density.
FINDINGS: There is a small, oval, circumscribed, superficial mass in the upper
outer left breast at the location of the mass felt by the patient,
marked with a metallic marker. No findings elsewhere in either
breast suspicious for malignancy. The patient has retropectoral
implants.

On physical exam, the patient has a small, rounded, palpable mass in
the 2:30 o'clock position of the left breast, 5 cm from the nipple.
She also indicated a 2nd small palpable mass more laterally in the 3
o'clock position of the breast, 10 cm from the nipple. There was a
smaller, more faintly palpable mass at that location. No mass was
palpable in the left axilla at the location of focal tenderness.

Targeted ultrasound is performed, showing a normal appearing
intramammary lymph node in the 2:30 o'clock position of the left
breast, 5 cm from the nipple, measuring 7 x 6 x 2 mm, corresponding
to the palpable mass.

There is also a normal appearing intramammary lymph node in the 3
o'clock position of the left breast, measuring 7 x 7 x 3 mm,
corresponding to the 2nd palpable mass.

Ultrasound of the left axilla demonstrated a single left axillary
lymph node with borderline, uniform cortical thickening measuring a
maximum of 3.3 mm in thickness. There is no focal or eccentric
thickening. The remainder of the left axillary lymph nodes have a
normal appearance.
IMPRESSION: 1. Normal appearing intramammary lymph nodes in the 2:30 o'clock and
3 o'clock positions of the left breast, corresponding to the
palpable masses.
2. Mildly reactive left axillary lymph node corresponding to the
focal tenderness to palpation in the left axilla.

RECOMMENDATION:
Bilateral screening mammogram in 1 year.

I have discussed the findings and recommendations with the patient.
If applicable, a reminder letter will be sent to the patient
regarding the next appointment.

BI-RADS CATEGORY  2: Benign.

## 2022-12-11 ENCOUNTER — Other Ambulatory Visit: Payer: Self-pay | Admitting: Allergy

## 2023-03-11 ENCOUNTER — Other Ambulatory Visit: Payer: Self-pay | Admitting: Allergy

## 2023-11-18 ENCOUNTER — Other Ambulatory Visit: Payer: Self-pay | Admitting: Allergy

## 2023-11-18 MED ORDER — EPINEPHRINE 0.3 MG/0.3ML IJ SOAJ: 0.3000 mg | INTRAMUSCULAR | 1 refills | Status: AC | PRN

## 2024-06-12 ENCOUNTER — Other Ambulatory Visit: Payer: Self-pay | Admitting: Allergy
